# Patient Record
Sex: Male | Born: 1945 | Race: White | Hispanic: No | Marital: Single | Smoking: Current every day smoker
Health system: Southern US, Community
[De-identification: ages and names within clinical notes are randomized; demographics above are authoritative.]

## PROBLEM LIST (undated history)

## (undated) DIAGNOSIS — R208 Other disturbances of skin sensation: Secondary | ICD-10-CM

## (undated) DIAGNOSIS — I739 Peripheral vascular disease, unspecified: Secondary | ICD-10-CM

## (undated) DIAGNOSIS — C801 Malignant (primary) neoplasm, unspecified: Secondary | ICD-10-CM

## (undated) DIAGNOSIS — F329 Major depressive disorder, single episode, unspecified: Secondary | ICD-10-CM

## (undated) DIAGNOSIS — T7840XA Allergy, unspecified, initial encounter: Secondary | ICD-10-CM

## (undated) DIAGNOSIS — M47816 Spondylosis without myelopathy or radiculopathy, lumbar region: Secondary | ICD-10-CM

## (undated) DIAGNOSIS — J449 Chronic obstructive pulmonary disease, unspecified: Secondary | ICD-10-CM

## (undated) DIAGNOSIS — M545 Low back pain, unspecified: Secondary | ICD-10-CM

## (undated) DIAGNOSIS — F419 Anxiety disorder, unspecified: Secondary | ICD-10-CM

## (undated) DIAGNOSIS — M5136 Other intervertebral disc degeneration, lumbar region: Secondary | ICD-10-CM

## (undated) DIAGNOSIS — I4891 Unspecified atrial fibrillation: Secondary | ICD-10-CM

## (undated) DIAGNOSIS — K219 Gastro-esophageal reflux disease without esophagitis: Secondary | ICD-10-CM

## (undated) DIAGNOSIS — I10 Essential (primary) hypertension: Secondary | ICD-10-CM

## (undated) DIAGNOSIS — E119 Type 2 diabetes mellitus without complications: Secondary | ICD-10-CM

## (undated) DIAGNOSIS — F32A Depression, unspecified: Secondary | ICD-10-CM

## (undated) HISTORY — DX: Chronic obstructive pulmonary disease, unspecified: J44.9

## (undated) HISTORY — DX: Other intervertebral disc degeneration, lumbar region: M51.36

## (undated) HISTORY — DX: Low back pain: M54.5

## (undated) HISTORY — DX: Spondylosis without myelopathy or radiculopathy, lumbar region: M47.816

## (undated) HISTORY — DX: Allergy, unspecified, initial encounter: T78.40XA

## (undated) HISTORY — DX: Type 2 diabetes mellitus without complications: E11.9

## (undated) HISTORY — DX: Essential (primary) hypertension: I10

## (undated) HISTORY — DX: Low back pain, unspecified: M54.50

## (undated) HISTORY — DX: Anxiety disorder, unspecified: F41.9

## (undated) HISTORY — PX: TONSILLECTOMY: SUR1361

## (undated) HISTORY — DX: Depression, unspecified: F32.A

## (undated) HISTORY — DX: Gastro-esophageal reflux disease without esophagitis: K21.9

## (undated) HISTORY — DX: Peripheral vascular disease, unspecified: I73.9

## (undated) HISTORY — DX: Other disturbances of skin sensation: R20.8

## (undated) HISTORY — DX: Major depressive disorder, single episode, unspecified: F32.9

## (undated) HISTORY — DX: Malignant (primary) neoplasm, unspecified: C80.1

## (undated) HISTORY — DX: Unspecified atrial fibrillation: I48.91

---

## 2006-12-12 ENCOUNTER — Encounter
Admission: RE | Admit: 2006-12-12 | Discharge: 2006-12-13 | Payer: Self-pay | Admitting: Physical Medicine & Rehabilitation

## 2006-12-12 ENCOUNTER — Ambulatory Visit: Payer: Self-pay | Admitting: Physical Medicine & Rehabilitation

## 2007-08-27 ENCOUNTER — Ambulatory Visit: Payer: Self-pay | Admitting: Cardiology

## 2010-03-05 ENCOUNTER — Encounter: Payer: Self-pay | Admitting: Physical Medicine & Rehabilitation

## 2010-06-27 NOTE — Group Therapy Note (Signed)
Consult requested by Dr. Dyann Ruddle in [place].  Consult requested for the  evaluation of chronic low back pain.   CHIEF COMPLAINT:  Chronic low back pain and poor balance.   A 65 year old male who has a 5 year history of lower back pain that he  describes as aching and occurring more with walking and bending.  He  rates his pain as 7 out of 10, mostly in the lower part of the back, no  difference between the right and left side of his back.  He denies any  history of falls, he does not recall any other traumatic events which  started before his low back.  He has had an MRI which he thinks may have  been 2-3 years ago.  He was told he had arthritis, I do not have that  report.  He can walk 5 minutes at a time and uses a cane.  He climbs  steps, he does not drive.  He was last employed in 1995.  He lives in  senior housing, has some difficulty with household duties.   REVIEW OF SYSTEMS:  Trouble walking with weakness.  He has had some  falls.   SOCIAL HISTORY:  Used Vodka in the past.  He was a heavy drinker in the  past but now drinks, per his report, 2-3 per day.  He smokes about 5  cigarettes per day.   PAST MEDICAL HISTORY:  1. Hypertension.  2. Reflux.  3. What sounds like borderline diabetes.   MEDICATIONS:  1. Norvasc 5 mg a day.  2. Fioricet 2 tablets three times a day for pain.  3. Nexium 40 mg a day.  4. Diabetic medication which he takes as needed, it sounds like      Glucophage 500 mg p.r.n.  5. He also takes Trazodone 50 mg nightly for sleep.   ALLERGIES:  NONE KNOWN.   He does not give any pertinent family history.   Other diagnoses include COPD.  Labs include a normal lipid panel, BUN  and creatinine normal, his GFR is normal, his MCV is normal, CBC is  normal.  He has had lumbar spine films on August 29, 2006, marginal  osteophytes and degenerative spondylosis of the lumber spine, no other  bony abnormalities noted.  He has also had thoracic spine films August 30, 2006 due to fall, marginal osteophyte formation and degenerative  spondylosis.  No compression fractures.   EXAMINATION:  Blood pressure 167/98, he states that he has been taking  his Norvasc on a regular basis, respiratory rate 18, O2 sat 96% room  air.  Mood and affect appropriate, orientation x3, ambulates with a  cane.  He has a stenotic appearing gait, flexed forward at the hips.  He  gets about neutral on extension, forward flexion about 50%, he has  difficulty with sideward bending and twisting.  His motor strength is 4  in the bilateral upper and lower extremities and sensation is intact in  bilateral upper and lower extremities to pinprick.  His deep tendon  reflexes are reduced at the bilateral ankles, normal at the knees,  biceps, triceps and brachial radialis.  His back has some tenderness to  palpation lumbosacral junction.  There was no evidence of scoliosis.  Upper extremity and lower extremity range of motion are normal but he  does have some pain that he states is in the back when he puts his hands  behind his back and his hands behind his head.  No evidence of muscle  atrophy in the extremities.   IMPRESSION:  1. Lumbar pain given age and x-ray findings may have some element of      stenosis, likely has facet arthropathy.  2. Balance disorder given history of heavy alcohol use in the past,      question whether he may have some cerebellar degeneration      contributing to his balance disorder.  If this progresses an MRI of      the head may be helpful as well.   PLAN:  1. We will order MRI of the lumbar spine without contrast, consider      lumbar facet injections.  2. Send to physical therapy to do flexion exercises as well as lower      extremity strengthening and work on balance.  3. Given alcohol abuse history would not use narcotic analgesics in      this case, may benefit from some Ultram, will give him 50 b.i.d.,      no history of seizures.  He also  may benefit from some topical      agent such as Lidoderm or even a Diclofenac topical.   I will see him back in followup, will get a urine drug screen today as  well.  This will include testing for alcohol.  If alcohol positive  today, would recommend some alcohol cessation counseling.      Erick Colace, M.D.  Electronically Signed     AEK/[facility]  D:  12/13/2006 15:30:53  T:  12/14/2006 12:46:51  Job #:  [id number]   cc:   [place]  Fax: [phone]

## 2013-07-10 ENCOUNTER — Other Ambulatory Visit: Payer: Self-pay | Admitting: *Deleted

## 2013-07-10 DIAGNOSIS — M79609 Pain in unspecified limb: Secondary | ICD-10-CM

## 2013-07-17 ENCOUNTER — Encounter: Payer: Self-pay | Admitting: Surgery

## 2013-07-27 ENCOUNTER — Other Ambulatory Visit: Payer: Self-pay | Admitting: Surgery

## 2013-07-27 DIAGNOSIS — I739 Peripheral vascular disease, unspecified: Secondary | ICD-10-CM

## 2013-07-31 ENCOUNTER — Encounter: Payer: Self-pay | Admitting: Surgery

## 2013-08-03 ENCOUNTER — Encounter: Payer: Self-pay | Admitting: Surgery

## 2013-08-03 ENCOUNTER — Ambulatory Visit (HOSPITAL_COMMUNITY)
Admission: RE | Admit: 2013-08-03 | Discharge: 2013-08-03 | Disposition: A | Discharge status: Home / Self Care | Payer: Medicare Other | Source: Ambulatory Visit | Attending: Surgery | Admitting: Surgery

## 2013-08-03 ENCOUNTER — Ambulatory Visit (INDEPENDENT_AMBULATORY_CARE_PROVIDER_SITE_OTHER): Payer: Medicare Other | Admitting: Surgery

## 2013-08-03 ENCOUNTER — Other Ambulatory Visit: Payer: Self-pay | Admitting: Surgery

## 2013-08-03 VITALS — BP 128/83 | HR 99 | Ht 72.0 in | Wt 162.1 lb

## 2013-08-03 DIAGNOSIS — M79609 Pain in unspecified limb: Secondary | ICD-10-CM | POA: Diagnosis present

## 2013-08-03 DIAGNOSIS — I739 Peripheral vascular disease, unspecified: Secondary | ICD-10-CM

## 2013-08-03 DIAGNOSIS — M79604 Pain in right leg: Secondary | ICD-10-CM

## 2013-08-03 MED ORDER — CILOSTAZOL 100 MG PO TABS: 100.0000 mg | ORAL_TABLET | Freq: Two times a day (BID) | ORAL | Status: DC

## 2013-08-03 NOTE — Progress Notes (Signed)
Patient name: Peter Neal MRN: [id number] DOB: 07/14/1945 Sex: male   Referred by: Dr. Francesco Runner  Reason for referral:  Chief Complaint  Patient presents with  . New Evaluation    claudication    HISTORY OF PRESENT ILLNESS: This is a 68 year old gentleman who is referred today for evaluation of lower extremity claudication.  It the patient has a history of low back pain with radicular pain into the right extremity.  L4-L5 epidural injection help with his back pain but he still having significant symptoms in his right leg which she describes as on the lateral surface of the leg from the hip to the knee.  The patient denies calf cramping with walking.  He denies nonhealing wounds on his leg.  He states that he gets pain at night which is predominantly on his heel.  Walking does exacerbate his symptoms somewhat.  The patient continues to smoke.  He has smoked 3 packs a day for a long time but has now quit to less than half a pack per day.  He suffers from hypertension which fluctuates with medications.  He states his cholesterol levels have been okay.  Past Medical History  Diagnosis Date  . Lower back pain   . Dysesthesia   . COPD (chronic obstructive pulmonary disease)   . Allergy   . Hypertension   . GERD (gastroesophageal reflux disease)   . Depression   . Anxiety   . Peripheral vascular disease   . DDD (degenerative disc disease), lumbar     Osteo arthritis  . Lumbar spondylosis   . Atrial fibrillation   . Diabetes mellitus without complication     Past Surgical History  Procedure Laterality Date  . Tonsillectomy      History   Social History  . Marital Status: Single    Spouse Name: N/A    Number of Children: N/A  . Years of Education: N/A   Occupational History  . Not on file.   Social History Main Topics  . Smoking status: Current Every Day Smoker -- 1.00 packs/day for 55 years  . Smokeless tobacco: Never Used  . Alcohol Use: 2.4 oz/week    4 Cans of  beer per week  . Drug Use: No  . Sexual Activity: Not on file   Other Topics Concern  . Not on file   Social History Narrative  . No narrative on file    Family History  Problem Relation Age of Onset  . Cancer Mother   . Diabetes Mother   . Cancer Sister   . Diabetes Sister   . Cancer Sister   . Diabetes Sister     Allergies as of 08/03/2013  . (No Known Allergies)    Current Outpatient Prescriptions on File Prior to Visit  Medication Sig Dispense Refill  . albuterol (PROAIR HFA) 108 (90 BASE) MCG/ACT inhaler Inhale into the lungs every 6 (six) hours as needed for wheezing or shortness of breath.      . ALPRAZolam (XANAX) 0.25 MG tablet Take 0.25 mg by mouth 3 (three) times daily.      Marland Kitchen AMLODIPINE BESYLATE PO Take 10 mg by mouth daily.      Marland Kitchen HYDROcodone-acetaminophen (NORCO) 10-325 MG per tablet Take 1 tablet by mouth every 6 (six) hours as needed.      . montelukast (SINGULAIR) 10 MG tablet Take 10 mg by mouth at bedtime.      . naproxen (NAPROSYN) 500 MG tablet Take 500 mg  by mouth daily as needed.      Marland Kitchen omeprazole (PRILOSEC) 20 MG capsule Take 20 mg by mouth daily.      Marland Kitchen PAROXETINE HCL PO Take 20 mg by mouth every morning.      Marland Kitchen TIZANIDINE HCL PO Take 2 mg by mouth every 8 (eight) hours as needed.       No current facility-administered medications on file prior to visit.     REVIEW OF SYSTEMS: Please see history of present illness, otherwise all systems are negative  PHYSICAL EXAMINATION: General: The patient appears their stated age.  Vital signs are BP 128/83  Pulse 99  Ht 6' (1.829 m)  Wt 162 lb 1.6 oz (73.528 kg)  BMI 21.98 kg/m2  SpO2 98% HEENT:  No gross abnormalities Pulmonary: Respirations are non-labored Abdomen: Soft and non-tender .  Aorta is not palpable Musculoskeletal: There are no major deformities.   Neurologic: No focal weakness or paresthesias are detected, Skin: There are no ulcer or rashes noted. Psychiatric: The patient has normal  affect. Cardiovascular: There is a regular rate and rhythm without significant murmur appreciated.  Palpable femoral pulses.  Palpable left popliteal pulse nonpalpable right.  No carotid bruits.  Diagnostic Studies: I have reviewed his outside imaging studies which reveal an ankle brachial index of 0.96 and the left and 0.61 on the right.  Duplex ultrasound was performed in our office today this shows a right superficial femoral artery occlusion.    Assessment:  Right leg pain Plan: The patient certainly has peripheral vascular disease which affects his right leg.  This is evidenced by the ultrasound today which shows right superficial femoral artery occlusion.  However, the patient's clinical symptoms correlate more with pain secondary to nerve irritation from lower back disc disease.  He reports his symptoms being from the hip to the knee on the lateral side of the leg which is in the L5 distribution.  The patient does not report classic symptoms for vascular claudication, which with a superficial femoral artery occlusion with reduced right calf cramping with activity.  I discussed this with the patient.  From a vascular perspective I told him that out optimize medical therapy at this time.  This would include a cholesterol management and initiation of statin therapy if his cholesterol levels show a LDL of greater than 100.  I also explained the importance of smoking cessation.  I did begin Pletal today to see if this has any improvement on his symptoms.  We discussed the side effect profile.  I told him to discontinue the medication as he has not noticed any benefit within the next one month.  The patient will be scheduled for followup in 3 months to see how he is doing.  Again I think his primary problem is his lower back disease.     Eldridge Abrahams, M.D. [facility] Office: [phone] Pager:  [phone]

## 2013-10-26 ENCOUNTER — Inpatient Hospital Stay (HOSPITAL_COMMUNITY)
Admission: EM | Admit: 2013-10-26 | Discharge: 2013-10-27 | DRG: 392 | Disposition: A | Discharge status: Home / Self Care | Payer: Medicare Other | Attending: Internal Medicine | Admitting: Internal Medicine

## 2013-10-26 ENCOUNTER — Emergency Department (HOSPITAL_COMMUNITY): Payer: Medicare Other

## 2013-10-26 ENCOUNTER — Encounter (HOSPITAL_COMMUNITY): Payer: Self-pay | Admitting: Emergency Medicine

## 2013-10-26 DIAGNOSIS — F17200 Nicotine dependence, unspecified, uncomplicated: Secondary | ICD-10-CM | POA: Diagnosis present

## 2013-10-26 DIAGNOSIS — F1020 Alcohol dependence, uncomplicated: Secondary | ICD-10-CM | POA: Diagnosis present

## 2013-10-26 DIAGNOSIS — I739 Peripheral vascular disease, unspecified: Secondary | ICD-10-CM | POA: Diagnosis present

## 2013-10-26 DIAGNOSIS — R634 Abnormal weight loss: Secondary | ICD-10-CM

## 2013-10-26 DIAGNOSIS — IMO0002 Reserved for concepts with insufficient information to code with codable children: Secondary | ICD-10-CM | POA: Diagnosis present

## 2013-10-26 DIAGNOSIS — J4489 Other specified chronic obstructive pulmonary disease: Secondary | ICD-10-CM | POA: Diagnosis present

## 2013-10-26 DIAGNOSIS — M5137 Other intervertebral disc degeneration, lumbosacral region: Secondary | ICD-10-CM | POA: Diagnosis present

## 2013-10-26 DIAGNOSIS — Z833 Family history of diabetes mellitus: Secondary | ICD-10-CM | POA: Diagnosis not present

## 2013-10-26 DIAGNOSIS — R1310 Dysphagia, unspecified: Secondary | ICD-10-CM | POA: Diagnosis present

## 2013-10-26 DIAGNOSIS — K5710 Diverticulosis of small intestine without perforation or abscess without bleeding: Secondary | ICD-10-CM | POA: Diagnosis present

## 2013-10-26 DIAGNOSIS — K219 Gastro-esophageal reflux disease without esophagitis: Secondary | ICD-10-CM | POA: Diagnosis present

## 2013-10-26 DIAGNOSIS — K2990 Gastroduodenitis, unspecified, without bleeding: Secondary | ICD-10-CM | POA: Diagnosis present

## 2013-10-26 DIAGNOSIS — K229 Disease of esophagus, unspecified: Secondary | ICD-10-CM | POA: Diagnosis present

## 2013-10-26 DIAGNOSIS — J449 Chronic obstructive pulmonary disease, unspecified: Secondary | ICD-10-CM | POA: Diagnosis present

## 2013-10-26 DIAGNOSIS — K2980 Duodenitis without bleeding: Secondary | ICD-10-CM | POA: Diagnosis present

## 2013-10-26 DIAGNOSIS — F1010 Alcohol abuse, uncomplicated: Secondary | ICD-10-CM | POA: Diagnosis present

## 2013-10-26 DIAGNOSIS — I4891 Unspecified atrial fibrillation: Secondary | ICD-10-CM | POA: Diagnosis present

## 2013-10-26 DIAGNOSIS — K228 Other specified diseases of esophagus: Secondary | ICD-10-CM | POA: Diagnosis present

## 2013-10-26 DIAGNOSIS — T18108A Unspecified foreign body in esophagus causing other injury, initial encounter: Secondary | ICD-10-CM | POA: Diagnosis present

## 2013-10-26 DIAGNOSIS — K2289 Other specified disease of esophagus: Secondary | ICD-10-CM

## 2013-10-26 DIAGNOSIS — I10 Essential (primary) hypertension: Secondary | ICD-10-CM | POA: Diagnosis present

## 2013-10-26 DIAGNOSIS — K2970 Gastritis, unspecified, without bleeding: Secondary | ICD-10-CM | POA: Diagnosis present

## 2013-10-26 DIAGNOSIS — M51379 Other intervertebral disc degeneration, lumbosacral region without mention of lumbar back pain or lower extremity pain: Secondary | ICD-10-CM | POA: Diagnosis present

## 2013-10-26 DIAGNOSIS — E119 Type 2 diabetes mellitus without complications: Secondary | ICD-10-CM | POA: Diagnosis present

## 2013-10-26 LAB — CBC WITH DIFFERENTIAL/PLATELET
BASOS PCT: 0 % (ref 0–1)
Basophils Absolute: 0 10*3/uL (ref 0.0–0.1)
EOS ABS: 0.1 10*3/uL (ref 0.0–0.7)
Eosinophils Relative: 1 % (ref 0–5)
HCT: 47.2 % (ref 39.0–52.0)
HEMOGLOBIN: 16.4 g/dL (ref 13.0–17.0)
Lymphocytes Relative: 19 % (ref 12–46)
Lymphs Abs: 2 10*3/uL (ref 0.7–4.0)
MCH: 34 pg (ref 26.0–34.0)
MCHC: 34.7 g/dL (ref 30.0–36.0)
MCV: 97.7 fL (ref 78.0–100.0)
MONOS PCT: 12 % (ref 3–12)
Monocytes Absolute: 1.3 10*3/uL — ABNORMAL HIGH (ref 0.1–1.0)
NEUTROS PCT: 68 % (ref 43–77)
Neutro Abs: 7.1 10*3/uL (ref 1.7–7.7)
Platelets: 253 10*3/uL (ref 150–400)
RBC: 4.83 MIL/uL (ref 4.22–5.81)
RDW: 13.5 % (ref 11.5–15.5)
WBC: 10.5 10*3/uL (ref 4.0–10.5)

## 2013-10-26 LAB — COMPREHENSIVE METABOLIC PANEL
ALBUMIN: 3.3 g/dL — AB (ref 3.5–5.2)
ALK PHOS: 125 U/L — AB (ref 39–117)
ALT: 11 U/L (ref 0–53)
ANION GAP: 16 — AB (ref 5–15)
AST: 17 U/L (ref 0–37)
BUN: 13 mg/dL (ref 6–23)
CO2: 26 mEq/L (ref 19–32)
Calcium: 10 mg/dL (ref 8.4–10.5)
Chloride: 97 mEq/L (ref 96–112)
Creatinine, Ser: 0.86 mg/dL (ref 0.50–1.35)
GFR calc Af Amer: >90 mL/min (ref >90)
GFR calc non Af Amer: 86 mL/min — ABNORMAL LOW (ref >90)
GLUCOSE: 109 mg/dL — AB (ref 70–99)
POTASSIUM: 3.5 meq/L — AB (ref 3.7–5.3)
Sodium: 139 mEq/L (ref 137–147)
Total Bilirubin: 0.5 mg/dL (ref 0.3–1.2)
Total Protein: 8.2 g/dL (ref 6.0–8.3)

## 2013-10-26 MED ORDER — MONTELUKAST SODIUM 10 MG PO TABS: 10.0000 mg | ORAL_TABLET | Freq: Every day | ORAL | Status: DC

## 2013-10-26 MED ORDER — ONDANSETRON HCL 4 MG/2ML IJ SOLN: 4.0000 mg | Freq: Four times a day (QID) | INTRAMUSCULAR | Status: DC | PRN

## 2013-10-26 MED ORDER — SODIUM CHLORIDE 0.9 % IV SOLN
INTRAVENOUS | Status: DC
  Administered 2013-10-26 – 2013-10-27 (×2): via INTRAVENOUS

## 2013-10-26 MED ORDER — ALBUTEROL SULFATE (2.5 MG/3ML) 0.083% IN NEBU: 3.0000 mL | INHALATION_SOLUTION | RESPIRATORY_TRACT | Status: DC | PRN

## 2013-10-26 MED ORDER — TIZANIDINE HCL 4 MG PO TABS
2.0000 mg | ORAL_TABLET | Freq: Three times a day (TID) | ORAL | Status: DC | PRN
  Filled 2013-10-26: qty 1

## 2013-10-26 MED ORDER — SODIUM CHLORIDE 0.9 % IV BOLUS (SEPSIS)
2000.0000 mL | Freq: Once | INTRAVENOUS | Status: AC
  Administered 2013-10-26: 1000 mL via INTRAVENOUS

## 2013-10-26 MED ORDER — PANTOPRAZOLE SODIUM 40 MG PO TBEC
40.0000 mg | DELAYED_RELEASE_TABLET | Freq: Every day | ORAL | Status: DC
  Administered 2013-10-26 – 2013-10-27 (×2): 40 mg via ORAL
  Filled 2013-10-26 (×2): qty 1

## 2013-10-26 MED ORDER — HYDROCODONE-ACETAMINOPHEN 10-325 MG PO TABS
1.0000 | ORAL_TABLET | Freq: Four times a day (QID) | ORAL | Status: DC | PRN
  Administered 2013-10-26 – 2013-10-27 (×2): 1 via ORAL
  Filled 2013-10-26 (×2): qty 1

## 2013-10-26 MED ORDER — ALBUTEROL SULFATE HFA 108 (90 BASE) MCG/ACT IN AERS: 2.0000 | INHALATION_SPRAY | RESPIRATORY_TRACT | Status: DC | PRN

## 2013-10-26 MED ORDER — ALPRAZOLAM 0.5 MG PO TABS
0.5000 mg | ORAL_TABLET | Freq: Four times a day (QID) | ORAL | Status: DC
  Administered 2013-10-26 – 2013-10-27 (×5): 0.5 mg via ORAL
  Filled 2013-10-26 (×4): qty 1

## 2013-10-26 MED ORDER — ONDANSETRON HCL 4 MG PO TABS: 4.0000 mg | ORAL_TABLET | Freq: Four times a day (QID) | ORAL | Status: DC | PRN

## 2013-10-26 MED ORDER — CILOSTAZOL 100 MG PO TABS
100.0000 mg | ORAL_TABLET | Freq: Two times a day (BID) | ORAL | Status: DC
  Administered 2013-10-27 (×2): 100 mg via ORAL
  Filled 2013-10-26 (×3): qty 1

## 2013-10-26 NOTE — ED Provider Notes (Signed)
CSN: [id number]     Arrival date & time 10/26/13  1531 History   First MD Initiated Contact with Patient 10/26/13 1630     This chart was scribed for Babette Relic, MD by Edison Simon, ED Scribe. This patient was seen in room A304/A304-01 and the patient's care was started at 4:33 PM.   Chief Complaint  Patient presents with  . Emesis   The history is provided by the patient. No language interpreter was used.   HPI Comments: Peter Neal is a 68 y.o. male with history of stable lower back pain radiating down his leg and COPD who presents to the Emergency Department complaining of dysphagia for solid food with onset 1 week ago. He states he is able to tolerate liquids and his own saliva. He states he has to have small amounts of solid food, otherwise he regurgitates it. He denies chest pain, shortness of breath, abdominal pain, or diarrhea.   Past Medical History  Diagnosis Date  . Lower back pain   . Dysesthesia   . COPD (chronic obstructive pulmonary disease)   . Allergy   . Hypertension   . GERD (gastroesophageal reflux disease)   . Depression   . Anxiety   . Peripheral vascular disease   . DDD (degenerative disc disease), lumbar     Osteo arthritis  . Lumbar spondylosis   . Atrial fibrillation   . Diabetes mellitus without complication    Past Surgical History  Procedure Laterality Date  . Tonsillectomy     Family History  Problem Relation Age of Onset  . Cancer Mother   . Diabetes Mother   . Cancer Sister   . Diabetes Sister   . Cancer Sister   . Diabetes Sister   . Colon cancer Neg Hx    History  Substance Use Topics  . Smoking status: Current Every Day Smoker -- 1.00 packs/day for 55 years  . Smokeless tobacco: Never Used  . Alcohol Use: 25.2 oz/week    42 Cans of beer per week     Comment: ETOH abuse, 6-12 beers a day    Review of Systems 10 Systems reviewed and are negative for acute change except as noted in the HPI.   Allergies  Review of patient's  allergies indicates no known allergies.  Home Medications   Prior to Admission medications   Medication Sig Start Date End Date Taking? Authorizing Provider  albuterol (PROAIR HFA) 108 (90 BASE) MCG/ACT inhaler Inhale 2 puffs into the lungs every 4 (four) hours as needed for wheezing or shortness of breath.    Yes Historical Provider, MD  ALPRAZolam Duanne Moron) 0.5 MG tablet Take 0.5 mg by mouth 4 (four) times daily.   Yes Historical Provider, MD  cilostazol (PLETAL) 100 MG tablet Take 100 mg by mouth 2 (two) times daily before a meal.   Yes Historical Provider, MD  HYDROcodone-acetaminophen (NORCO) 10-325 MG per tablet Take 1 tablet by mouth every 6 (six) hours as needed for moderate pain or severe pain.    Yes Historical Provider, MD  montelukast (SINGULAIR) 10 MG tablet Take 10 mg by mouth at bedtime.   Yes Historical Provider, MD  omeprazole (PRILOSEC) 20 MG capsule Take 20 mg by mouth 2 (two) times daily.   Yes Historical Provider, MD  tiZANidine (ZANAFLEX) 2 MG tablet Take 2 mg by mouth every 8 (eight) hours as needed for muscle spasms.   Yes Historical Provider, MD   BP 111/74  Pulse 90  Temp(Src)  96.8 F (36 C) (Axillary)  Resp 18  Ht 6\' 1"  (1.854 m)  Wt 144 lb 0.1 oz (65.32 kg)  BMI 19.00 kg/m2  SpO2 92% Physical Exam  Nursing note and vitals reviewed. Constitutional:  Awake, alert, nontoxic appearance.  HENT:  Head: Atraumatic.  Eyes: Right eye exhibits no discharge. Left eye exhibits no discharge.  Neck: Neck supple.  Cardiovascular: Normal rate, regular rhythm and normal heart sounds.   No murmur heard. Pulmonary/Chest: Effort normal and breath sounds normal. No respiratory distress. He has no wheezes. He has no rales. He exhibits no tenderness.  Abdominal: Soft. There is no tenderness. There is no rebound.  Musculoskeletal: He exhibits no tenderness.  Baseline ROM, no obvious new focal weakness.  Neurological:  Mental status and motor strength appears baseline for  patient and situation.  Skin: No rash noted.  Psychiatric: He has a normal mood and affect.    ED Course  Procedures (including critical care time) D/w GI Fields recs soft mechanical diet consistency of milkshake; OutPt f/u. 1715  1750 additional history obtained from family states patient has been unable to eat or drink anything today including regurgitating own saliva and family states patient is lying so ED nursing directly observed the patient attempt to drink and witnessed regurgitation of drink and patient's own saliva so GI contacted again and patient will be placed in observation to [facility] with GI consultation either tonight or tomorrow morning.  Patient / Family / Caregiver informed of clinical course, understand medical decision-making process, and agree with plan.   Labs Review Labs Reviewed  CBC WITH DIFFERENTIAL - Abnormal; Notable for the following:    Monocytes Absolute 1.3 (*)    All other components within normal limits  COMPREHENSIVE METABOLIC PANEL - Abnormal; Notable for the following:    Potassium 3.5 (*)    Glucose, Bld 109 (*)    Albumin 3.3 (*)    Alkaline Phosphatase 125 (*)    GFR calc non Af Amer 86 (*)    Anion gap 16 (*)    All other components within normal limits  COMPREHENSIVE METABOLIC PANEL - Abnormal; Notable for the following:    Albumin 2.8 (*)    GFR calc non Af Amer 89 (*)    All other components within normal limits  CBC  SURGICAL PATHOLOGY    Imaging Review Ct Chest W Contrast  10/27/2013   CLINICAL DATA:  Esophageal mass  EXAM: CT CHEST, ABDOMEN, AND PELVIS WITH CONTRAST  TECHNIQUE: Multidetector CT imaging of the chest, abdomen and pelvis was performed following the standard protocol during bolus administration of intravenous contrast.  CONTRAST:  33mL OMNIPAQUE IOHEXOL 300 MG/ML SOLN, 160mL OMNIPAQUE IOHEXOL 300 MG/ML SOLN  COMPARISON:  None  FINDINGS: CT CHEST FINDINGS  Mediastinum: The heart size is normal. There is no  pericardial effusion. Calcified atherosclerotic disease involves the thoracic aorta as well as the RCA, LAD and left circumflex coronary arteries. The trachea appears patent and is midline.  Circumferential esophageal mass is identified. This is centered at the level of the left atrium and measures roughly 3.6 x 1.8 x 10.1 cm.At the GE junction there are several small paraesophageal lymph nodes measuring up to 7 mm, image 53/series 6. Enhancing sub- carinal lymph node measures 7 mm, image 31/series 6. Small left paratracheal lymph node at the level of the thoracic inlet measures 7 mm, image 10/series 6. No right paratracheal, prevascular, AP window or hilar adenopathy. There is no axillary or supraclavicular adenopathy.  Lungs/Pleura: There is no pleural effusion. Dependent changes are noted in the lung bases posteriorly. Diffuse interstitial accentuation is identified. There is mild peripheral interstitial reticulation is identified. A fine nodular appearance is identified bilaterally. Several dominant nodules are noted. Index nodule in the right upper lobe measures 1.4 cm, image 30/ series 12. Index nodule in the left lower lobe measures 4 mm.  CT ABDOMEN AND PELVIS FINDINGS  Hepatobiliary: Multiple ill-defined low-attenuation lesions are identified in both lobes of liver. Index lesion within the left hepatic lobe measures 1.9 cm, image 13 of series 3. There is a lesion within the right hepatic lobe measuring 1 cm, image 15/series 3. Calcifications within the wall of gallbladder noted. There is no biliary dilatation.  Spleen: Appears normal.  Pancreas: Diffuse atrophy of the pancreas.  Stomach/Bowel: The stomach and the small bowel loops have a normal course and caliber. No obstruction. Normal appearance of the colon.  Adrenals/urinary tract: The adrenal glands are both normal. Normal appearance of both kidneys. The urinary bladder appears within normal limits.  Vascular/Lymphatic: Calcified atherosclerotic  disease involves the abdominal aorta. Large gastrohepatic ligament lymph node measures 2.9 x 2.1 cm. No mesenteric adenopathy. There is no pelvic or inguinal adenopathy.  Reproductive: Prostate gland and seminal vesicles are unremarkable.  Musculoskeletal: Review of the visualized bony structures shows advanced changes of a new vascular necrosis and secondary degenerative change of the right hip. The left hip appears normal. No aggressive lytic or sclerotic bone lesions noted.  Other: None  IMPRESSION: 1. 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 2. 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 3. Enlarged upper abdominal lymph node worrisome for metastatic adenopathy. 4. Small enhancing mediastinal lymph nodes may also represent sites of metastasis. 5. 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 6. Atherosclerotic disease including multi vessel coronary artery calcification.   Electronically Signed   By: Kerby Moors M.D.   On: 10/27/2013 19:20   Ct Abdomen Pelvis W Contrast  10/27/2013   CLINICAL DATA:  Esophageal mass  EXAM: CT CHEST, ABDOMEN, AND PELVIS WITH CONTRAST  TECHNIQUE: Multidetector CT imaging of the chest, abdomen and pelvis was performed following the standard protocol during bolus administration of intravenous contrast.  CONTRAST:  25mL OMNIPAQUE IOHEXOL 300 MG/ML SOLN, 135mL OMNIPAQUE IOHEXOL 300 MG/ML SOLN  COMPARISON:  None  FINDINGS: CT CHEST FINDINGS  Mediastinum: The heart size is normal. There is no pericardial effusion. Calcified atherosclerotic disease involves the thoracic aorta as well as the RCA, LAD and left circumflex coronary arteries. The trachea appears patent and is midline.  Circumferential esophageal mass is identified. This is centered at the level of the left atrium and measures roughly 3.6 x 1.8 x 10.1 cm.At the GE junction there are several small  paraesophageal lymph nodes measuring up to 7 mm, image 53/series 6. Enhancing sub- carinal lymph node measures 7 mm, image 31/series 6. Small left paratracheal lymph node at the level of the thoracic inlet measures 7 mm, image 10/series 6. No right paratracheal, prevascular, AP window or hilar adenopathy. There is no axillary or supraclavicular adenopathy.  Lungs/Pleura: There is no pleural effusion. Dependent changes are noted in the lung bases posteriorly. Diffuse interstitial accentuation is identified. There is mild peripheral interstitial reticulation is identified. A fine nodular appearance is identified bilaterally. Several dominant nodules are noted. Index nodule in the right upper lobe measures 1.4 cm, image 30/ series 12. Index nodule in the left lower  lobe measures 4 mm.  CT ABDOMEN AND PELVIS FINDINGS  Hepatobiliary: Multiple ill-defined low-attenuation lesions are identified in both lobes of liver. Index lesion within the left hepatic lobe measures 1.9 cm, image 13 of series 3. There is a lesion within the right hepatic lobe measuring 1 cm, image 15/series 3. Calcifications within the wall of gallbladder noted. There is no biliary dilatation.  Spleen: Appears normal.  Pancreas: Diffuse atrophy of the pancreas.  Stomach/Bowel: The stomach and the small bowel loops have a normal course and caliber. No obstruction. Normal appearance of the colon.  Adrenals/urinary tract: The adrenal glands are both normal. Normal appearance of both kidneys. The urinary bladder appears within normal limits.  Vascular/Lymphatic: Calcified atherosclerotic disease involves the abdominal aorta. Large gastrohepatic ligament lymph node measures 2.9 x 2.1 cm. No mesenteric adenopathy. There is no pelvic or inguinal adenopathy.  Reproductive: Prostate gland and seminal vesicles are unremarkable.  Musculoskeletal: Review of the visualized bony structures shows advanced changes of a new vascular necrosis and secondary degenerative  change of the right hip. The left hip appears normal. No aggressive lytic or sclerotic bone lesions noted.  Other: None  IMPRESSION: 1. 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 2. 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 3. Enlarged upper abdominal lymph node worrisome for metastatic adenopathy. 4. Small enhancing mediastinal lymph nodes may also represent sites of metastasis. 5. 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 6. Atherosclerotic disease including multi vessel coronary artery calcification.   Electronically Signed   By: Kerby Moors M.D.   On: 10/27/2013 19:20     EKG Interpretation None     Patient / Family / Caregiver understand and agree with initial ED impression and plan with expectations set for ED visit.   MDM   Final diagnoses:  Dysphagia    I personally performed the services described in this documentation, which was scribed in my presence. The recorded information has been reviewed and is accurate. The patient appears reasonably stabilized for admission considering the current resources, flow, and capabilities available in the ED at this time, and I doubt any other [facility] requiring further screening and/or treatment in the ED prior to admission.   Babette Relic, MD 10/28/13 [phone]

## 2013-10-26 NOTE — H&P (Signed)
[facility] History and Physical  Khristopher Kapaun ERD:[id number] DOB: 10-21-1945 DOA: 10/26/2013  Referring physician: ER PCP: Cleda Mccreedy, MD   Chief Complaint: Dysphagia  HPI: Peter Neal is a 68 y.o. male  This is a 68 year old man who complains of fairly sudden onset of dysphagia for the last week. Apparently did not swallow his own sputum at this point. He also describes a 40 pound weight loss in the last couple of months. He is a smoker of 3 packs of cigarettes per day. He drinks alcohol 12-24 beers a day usually. He denies any nausea or vomiting or abdominal pain. He is now being admitted for further investigation.   Review of Systems:  .  HEENT:  No headaches, Difficulty swallowing,Tooth/dental problems,Sore throat,  No sneezing, itching, ear ache, nasal congestion, post nasal drip,  Cardio-vascular:  No chest pain, Orthopnea, PND, swelling in lower extremities, anasarca, dizziness, palpitations   Resp:  No shortness of breath with exertion or at rest. No excess mucus, no productive cough, No non-productive cough, No coughing up of blood.No change in color of mucus.No wheezing.No chest wall deformity  Skin:  no rash or lesions.  GU:  no dysuria, change in color of urine, no urgency or frequency. No flank pain.  Musculoskeletal:  No joint pain or swelling. No decreased range of motion. No back pain.  Psych:  No change in mood or affect. No depression or anxiety. No memory loss.   Past Medical History  Diagnosis Date  . Lower back pain   . Dysesthesia   . COPD (chronic obstructive pulmonary disease)   . Allergy   . Hypertension   . GERD (gastroesophageal reflux disease)   . Depression   . Anxiety   . Peripheral vascular disease   . DDD (degenerative disc disease), lumbar     Osteo arthritis  . Lumbar spondylosis   . Atrial fibrillation   . Diabetes mellitus without complication    Past Surgical History  Procedure Laterality Date  . Tonsillectomy      Social History:  reports that he has been smoking.  He has never used smokeless tobacco. He reports that he drinks about 2.4 ounces of alcohol per week. He reports that he does not use illicit drugs.  No Known Allergies  Family History  Problem Relation Age of Onset  . Cancer Mother   . Diabetes Mother   . Cancer Sister   . Diabetes Sister   . Cancer Sister   . Diabetes Sister      Prior to Admission medications   Medication Sig Start Date End Date Taking? Authorizing Provider  albuterol (PROAIR HFA) 108 (90 BASE) MCG/ACT inhaler Inhale 2 puffs into the lungs every 4 (four) hours as needed for wheezing or shortness of breath.    Yes Historical Provider, MD  ALPRAZolam Duanne Moron) 0.5 MG tablet Take 0.5 mg by mouth 4 (four) times daily.   Yes Historical Provider, MD  cilostazol (PLETAL) 100 MG tablet Take 100 mg by mouth 2 (two) times daily before a meal.   Yes Historical Provider, MD  HYDROcodone-acetaminophen (NORCO) 10-325 MG per tablet Take 1 tablet by mouth every 6 (six) hours as needed for moderate pain or severe pain.    Yes Historical Provider, MD  montelukast (SINGULAIR) 10 MG tablet Take 10 mg by mouth at bedtime.   Yes Historical Provider, MD  omeprazole (PRILOSEC) 20 MG capsule Take 20 mg by mouth 2 (two) times daily.   Yes Historical Provider, MD  tiZANidine (  ZANAFLEX) 2 MG tablet Take 2 mg by mouth every 8 (eight) hours as needed for muscle spasms.   Yes Historical Provider, MD   Physical Exam: Filed Vitals:   10/26/13 1546 10/26/13 1641 10/26/13 1803  BP: 116/81 137/102 133/91  Pulse: 112 111 101  Temp: 97.6 F (36.4 C) 97.6 F (36.4 C)   TempSrc: Oral Oral   Resp: 18 20 18   Height: 6\' 1"  (1.854 m)    Weight: 65.431 kg (144 lb 4 oz)    SpO2: 98% 98% 98%    Wt Readings from Last 3 Encounters:  10/26/13 65.431 kg (144 lb 4 oz)  08/03/13 73.528 kg (162 lb 1.6 oz)    General:  Appears calm and comfortable. No supraclavicular lymphadenopathy. No clubbing. Eyes:  PERRL, normal lids, irises & conjunctiva ENT: grossly normal hearing, lips & tongue Neck: no LAD, masses or thyromegaly Cardiovascular: RRR, no m/r/g. No LE edema. Telemetry: SR, no arrhythmias  Respiratory: CTA bilaterally, no w/r/r. Normal respiratory effort. Abdomen: soft, ntnd Skin: no rash or induration seen on limited exam Musculoskeletal: grossly normal tone BUE/BLE Psychiatric: grossly normal mood and affect, speech fluent and appropriate Neurologic: grossly non-focal.          Labs on Admission:  Basic Metabolic Panel:  Recent Labs Lab 10/26/13 1621  NA 139  K 3.5*  CL 97  CO2 26  GLUCOSE 109*  BUN 13  CREATININE 0.86  CALCIUM 10.0   Liver Function Tests:  Recent Labs Lab 10/26/13 1621  AST 17  ALT 11  ALKPHOS 125*  BILITOT 0.5  PROT 8.2  ALBUMIN 3.3*   No results found for this basename: LIPASE, AMYLASE,  in the last 168 hours No results found for this basename: AMMONIA,  in the last 168 hours CBC:  Recent Labs Lab 10/26/13 1621  WBC 10.5  NEUTROABS 7.1  HGB 16.4  HCT 47.2  MCV 97.7  PLT 253   Cardiac Enzymes: No results found for this basename: CKTOTAL, CKMB, CKMBINDEX, TROPONINI,  in the last 168 hours  BNP (last 3 results) No results found for this basename: PROBNP,  in the last 8760 hours CBG: No results found for this basename: GLUCAP,  in the last 168 hours  Radiological Exams on Admission: Dg Chest 2 View  10/26/2013   CLINICAL DATA:  Difficulty swallowing x1 week  EXAM: CHEST  2 VIEW  COMPARISON:  None.  FINDINGS: Chronic interstitial markings. No focal consolidation. No pleural effusion or pneumothorax.  The heart is normal in size.  Mild degenerative changes of the visualized thoracolumbar spine.  IMPRESSION: No evidence of acute cardiopulmonary disease.   Electronically Signed   By: Julian Hy M.D.   On: 10/26/2013 18:54    Assessment/Plan   1. Dysphagia, fairly rapid onset associated with weight loss of 40 pounds in  the last couple of months. Etiology unclear, but malignancy would be a major concern. 2. Tobacco abuse. 3. Alcohol abuse.  Plan: 1. Admit to medical floor. 2. IV fluids. 3. Gastroenterology consultation with EGD soon.  Other recommendations will depend on patient's hospital progress.   Code Status: Full code.   DVT Prophylaxis: SCDs.  Family Communication: I discussed the plan with patient at the bedside.   Disposition Plan: Depending on progress.   Time spent: 60 minutes.  Doree Albee [facility] Pager [phone].  **Disclaimer: This note may have been dictated with voice recognition software. Similar sounding words can inadvertently be transcribed and this note may contain transcription errors  which may not have been corrected upon publication of note.**

## 2013-10-26 NOTE — ED Notes (Addendum)
Difficulty swallowing for 1 week, food comes up. Seen by MD and told to come here.  Able  Swallow saliva.

## 2013-10-26 NOTE — ED Notes (Signed)
Spoke with Dr. Oneida Alar who instructed that the patient be NPO after midnight tonight and show up tomorrow morning at 1030 at endoscopy for an upper endoscopy. Communicated with the patient and family at bedside to relay message and gave written instructions as well.

## 2013-10-27 ENCOUNTER — Encounter (HOSPITAL_COMMUNITY)
Admission: EM | Disposition: A | Discharge status: Home / Self Care | Payer: Self-pay | Source: Home / Self Care | Attending: Internal Medicine

## 2013-10-27 ENCOUNTER — Encounter (HOSPITAL_COMMUNITY): Payer: Self-pay | Admitting: Gastroenterology

## 2013-10-27 ENCOUNTER — Inpatient Hospital Stay (HOSPITAL_COMMUNITY): Payer: Medicare Other

## 2013-10-27 DIAGNOSIS — T18108A Unspecified foreign body in esophagus causing other injury, initial encounter: Secondary | ICD-10-CM

## 2013-10-27 DIAGNOSIS — K2289 Other specified disease of esophagus: Secondary | ICD-10-CM | POA: Diagnosis present

## 2013-10-27 DIAGNOSIS — F17200 Nicotine dependence, unspecified, uncomplicated: Secondary | ICD-10-CM

## 2013-10-27 DIAGNOSIS — F1010 Alcohol abuse, uncomplicated: Secondary | ICD-10-CM

## 2013-10-27 DIAGNOSIS — K228 Other specified diseases of esophagus: Secondary | ICD-10-CM | POA: Diagnosis present

## 2013-10-27 DIAGNOSIS — K229 Disease of esophagus, unspecified: Principal | ICD-10-CM

## 2013-10-27 HISTORY — PX: ESOPHAGOGASTRODUODENOSCOPY: SHX5428

## 2013-10-27 LAB — CBC
HEMATOCRIT: 43.1 % (ref 39.0–52.0)
Hemoglobin: 14.7 g/dL (ref 13.0–17.0)
MCH: 33.6 pg (ref 26.0–34.0)
MCHC: 34.1 g/dL (ref 30.0–36.0)
MCV: 98.6 fL (ref 78.0–100.0)
PLATELETS: 242 10*3/uL (ref 150–400)
RBC: 4.37 MIL/uL (ref 4.22–5.81)
RDW: 13.6 % (ref 11.5–15.5)
WBC: 8.6 10*3/uL (ref 4.0–10.5)

## 2013-10-27 LAB — COMPREHENSIVE METABOLIC PANEL
ALT: 9 U/L (ref 0–53)
AST: 14 U/L (ref 0–37)
Albumin: 2.8 g/dL — ABNORMAL LOW (ref 3.5–5.2)
Alkaline Phosphatase: 108 U/L (ref 39–117)
Anion gap: 11 (ref 5–15)
BILIRUBIN TOTAL: 0.5 mg/dL (ref 0.3–1.2)
BUN: 13 mg/dL (ref 6–23)
CHLORIDE: 101 meq/L (ref 96–112)
CO2: 29 mEq/L (ref 19–32)
Calcium: 9.2 mg/dL (ref 8.4–10.5)
Creatinine, Ser: 0.79 mg/dL (ref 0.50–1.35)
GFR calc Af Amer: >90 mL/min (ref >90)
GFR calc non Af Amer: 89 mL/min — ABNORMAL LOW (ref >90)
Glucose, Bld: 92 mg/dL (ref 70–99)
Potassium: 3.9 mEq/L (ref 3.7–5.3)
Sodium: 141 mEq/L (ref 137–147)
Total Protein: 7 g/dL (ref 6.0–8.3)

## 2013-10-27 SURGERY — EGD (ESOPHAGOGASTRODUODENOSCOPY)
Anesthesia: Moderate Sedation

## 2013-10-27 MED ORDER — SODIUM CHLORIDE 0.9 % IV SOLN: INTRAVENOUS | Status: DC

## 2013-10-27 MED ORDER — MEPERIDINE HCL 100 MG/ML IJ SOLN
INTRAMUSCULAR | Status: DC | PRN
  Administered 2013-10-27 (×2): 25 mg via INTRAVENOUS
  Administered 2013-10-27: 50 mg via INTRAVENOUS

## 2013-10-27 MED ORDER — MIDAZOLAM HCL 5 MG/5ML IJ SOLN
INTRAMUSCULAR | Status: AC
  Filled 2013-10-27: qty 10

## 2013-10-27 MED ORDER — MIDAZOLAM HCL 5 MG/5ML IJ SOLN
INTRAMUSCULAR | Status: DC | PRN
  Administered 2013-10-27 (×2): 1 mg via INTRAVENOUS
  Administered 2013-10-27: 2 mg via INTRAVENOUS

## 2013-10-27 MED ORDER — STERILE WATER FOR IRRIGATION IR SOLN
Status: DC | PRN
Start: 2013-10-27 — End: 2013-10-27
  Administered 2013-10-27: 12:00:00

## 2013-10-27 MED ORDER — NICOTINE 21 MG/24HR TD PT24
21.0000 mg | MEDICATED_PATCH | Freq: Every day | TRANSDERMAL | Status: DC
  Administered 2013-10-27: 21 mg via TRANSDERMAL
  Filled 2013-10-27: qty 1

## 2013-10-27 MED ORDER — PROMETHAZINE HCL 25 MG/ML IJ SOLN
12.5000 mg | Freq: Once | INTRAMUSCULAR | Status: AC
  Administered 2013-10-27: 12.5 mg via INTRAVENOUS
  Filled 2013-10-27: qty 1

## 2013-10-27 MED ORDER — LIDOCAINE VISCOUS 2 % MT SOLN
OROMUCOSAL | Status: AC
  Filled 2013-10-27: qty 15

## 2013-10-27 MED ORDER — SODIUM CHLORIDE 0.9 % IV SOLN
INTRAVENOUS | Status: DC
  Administered 2013-10-27: 1000 mL via INTRAVENOUS

## 2013-10-27 MED ORDER — IOHEXOL 300 MG/ML  SOLN
50.0000 mL | Freq: Once | INTRAMUSCULAR | Status: AC | PRN
  Administered 2013-10-27: 50 mL via ORAL

## 2013-10-27 MED ORDER — MINERAL OIL PO OIL
TOPICAL_OIL | ORAL | Status: AC
  Filled 2013-10-27: qty 30

## 2013-10-27 MED ORDER — PANTOPRAZOLE SODIUM 40 MG PO TBEC
40.0000 mg | DELAYED_RELEASE_TABLET | Freq: Two times a day (BID) | ORAL | Status: DC
  Administered 2013-10-27: 40 mg via ORAL
  Filled 2013-10-27: qty 1

## 2013-10-27 MED ORDER — MEPERIDINE HCL 100 MG/ML IJ SOLN
INTRAMUSCULAR | Status: AC
  Filled 2013-10-27: qty 2

## 2013-10-27 MED ORDER — IOHEXOL 300 MG/ML  SOLN
100.0000 mL | Freq: Once | INTRAMUSCULAR | Status: AC | PRN
  Administered 2013-10-27: 100 mL via INTRAVENOUS

## 2013-10-27 MED ORDER — LIDOCAINE VISCOUS 2 % MT SOLN
OROMUCOSAL | Status: DC | PRN
  Administered 2013-10-27: 3 mL via OROMUCOSAL

## 2013-10-27 NOTE — H&P (Signed)
Primary Care Physician:  Cleda Mccreedy, MD Primary Gastroenterologist:  Dr. Oneida Alar  Pre-Procedure History & Physical: HPI:  Peter Neal is a 68 y.o. male here for DYSPHAGIA.  Past Medical History  Diagnosis Date  . Lower back pain   . Dysesthesia   . COPD (chronic obstructive pulmonary disease)   . Allergy   . Hypertension   . GERD (gastroesophageal reflux disease)   . Depression   . Anxiety   . Peripheral vascular disease   . DDD (degenerative disc disease), lumbar     Osteo arthritis  . Lumbar spondylosis   . Atrial fibrillation   . Diabetes mellitus without complication     Past Surgical History  Procedure Laterality Date  . Tonsillectomy      Prior to Admission medications   Medication Sig Start Date End Date Taking? Authorizing Provider  albuterol (PROAIR HFA) 108 (90 BASE) MCG/ACT inhaler Inhale 2 puffs into the lungs every 4 (four) hours as needed for wheezing or shortness of breath.    Yes Historical Provider, MD  ALPRAZolam Duanne Moron) 0.5 MG tablet Take 0.5 mg by mouth 4 (four) times daily.   Yes Historical Provider, MD  cilostazol (PLETAL) 100 MG tablet Take 100 mg by mouth 2 (two) times daily before a meal.   Yes Historical Provider, MD  HYDROcodone-acetaminophen (NORCO) 10-325 MG per tablet Take 1 tablet by mouth every 6 (six) hours as needed for moderate pain or severe pain.    Yes Historical Provider, MD  montelukast (SINGULAIR) 10 MG tablet Take 10 mg by mouth at bedtime.   Yes Historical Provider, MD  omeprazole (PRILOSEC) 20 MG capsule Take 20 mg by mouth 2 (two) times daily.   Yes Historical Provider, MD  tiZANidine (ZANAFLEX) 2 MG tablet Take 2 mg by mouth every 8 (eight) hours as needed for muscle spasms.   Yes Historical Provider, MD    Allergies as of 10/26/2013  . (No Known Allergies)    Family History  Problem Relation Age of Onset  . Cancer Mother   . Diabetes Mother   . Cancer Sister   . Diabetes Sister   . Cancer Sister   . Diabetes Sister    . Colon cancer Neg Hx     History   Social History  . Marital Status: Single    Spouse Name: N/A    Number of Children: N/A  . Years of Education: N/A   Occupational History  . Not on file.   Social History Main Topics  . Smoking status: Current Every Day Smoker -- 1.00 packs/day for 55 years  . Smokeless tobacco: Never Used  . Alcohol Use: 25.2 oz/week    42 Cans of beer per week     Comment: ETOH abuse, 6-12 beers a day  . Drug Use: No  . Sexual Activity: Not on file   Other Topics Concern  . Not on file   Social History Narrative  . No narrative on file    Review of Systems: See HPI, otherwise negative ROS   Physical Exam: BP 129/76  Pulse 90  Temp(Src) 97.6 F (36.4 C) (Oral)  Resp 19  Ht 6\' 1"  (1.854 m)  Wt 144 lb 0.1 oz (65.32 kg)  BMI 19.00 kg/m2  SpO2 94% General:   Alert,  pleasant and cooperative in NAD Head:  Normocephalic and atraumatic. Neck:  Supple; Lungs:  Clear throughout to auscultation.    Heart:  Regular rate and rhythm. Abdomen:  Soft, nontender and nondistended. Normal  bowel sounds, without guarding, and without rebound.   Neurologic:  Alert and  oriented x4;  grossly normal neurologically.  Impression/Plan:    DYSPHAGIA  PLAN:  EGD/?DIL TODAY

## 2013-10-27 NOTE — Discharge Instructions (Signed)
Esophageal Cancer Esophageal cancer occurs when abnormal cells within the esophagus begin to divide rapidly and uncontrollably. The esophagus is the tube that carries food and drink from the throat into the stomach.  CAUSES  The exact cause of esophageal cancer is not known. It is believed that esophageal cancer occurs due to many factors. People are at a higher risk of developing esophageal cancer if they:  Are older than 70 years of age.  Are male.  Smoke or use tobacco.  Drink heavily.  Eat a poor diet (low in fruits and vegetables).  Are overweight (obese).  Have conditions which result in long-standing damage or irritation to the esophagus. These conditions include:  Acid reflux (the stomach acid leaks back up the esophagus, damaging it).  Barrett Esophagus (abnormal cells are found in the lower part of the esophagus, usually due to acid reflux occurring over a long period of time).  Achalasia (the muscles and nerves of the esophagus do not work properly. Food and drink do not move in a normal way down the esophagus and into the stomach).  Esophageal webs (thin strings of tissues grow within the esophagus).  Damage due to toxic exposures (an example would be swallowing a caustic poison such as lye). SYMPTOMS  Symptoms can include:  Trouble swallowing.  Chest or back pain.  Unintentional weight loss.  Severe tiredness (fatigue).  Hoarse voice.  Cough. DIAGNOSIS  Esophageal cancer is usually diagnosed by performing:  Barium swallow. After drinking barium (a liquid that coats the esophagus), a series of X-rays are taken which can reveal abnormalities.  Endoscopic exam. A lighted scope is used to view the inside of the esophagus.  Biopsy. Small pieces of the esophagus are removed for exam in a lab. This can be done through the scope used for an endoscopic exam. TREATMENT  Treatment of esophageal cancer depends on a number of factors, including:   Characteristics  of the cancer cells present.  Tumor size.  Whether the cancer has spread to lymph nodes or to other more distant locations (such as other organs or bone). The types of treatments used for esophageal cancer include:  Surgery to remove as much of the cancer as possible.  Chemotherapy (medicines that kill cancer cells).  Radiation therapy to kill cancer cells.  Combinations of chemotherapy and radiation therapy.  Biological therapy that uses antibodies in ways that can take advantage of the weaknesses of the tumor cells. Your caregivers will also address your needs for pain relief, nutrition, and help with swallowing problems if these develop.  HOME CARE INSTRUCTIONS   Only take over-the-counter or prescription medicines for pain, discomfort or fever as directed by your caregiver.  Maintain a healthy diet. Advice from a nutritionist can be helpful when addressing your specific needs.  Consider joining a support group. This may help you learn to cope with the stress of having esophageal cancer.  Seek advice to help you manage treatment side effects. SEEK MEDICAL CARE IF:  You develop problems with swallowing that are getting worse.  You notice new fatigue or weakness.  You experience unintentional weight loss. SEEK IMMEDIATE MEDICAL CARE IF:  You have a sudden increase in pain.  You have trouble breathing.  You have a fever.  You vomit blood or black material that looks like coffee grounds.  You faint. Document Released: 01/12/2008 Document Revised: 04/23/2011 Document Reviewed: 01/12/2008 ExitCare Patient Information 2015 [employer]. This information is not intended to replace advice given to you by your health   care provider. Make sure you discuss any questions you have with your health care provider.

## 2013-10-27 NOTE — Op Note (Signed)
[facility] [address] [place], [address]   ENDOSCOPY PROCEDURE REPORT  PATIENT: Peter Neal, Peter Neal  MR#: [id number] BIRTHDATE: 02-27-1945 , 66  yrs. old GENDER: Male ENDOSCOPIST: Barney Drain, MD REFERRED UD:THYHO Qureshi, M.D. PROCEDURE DATE: 10/27/2013 PROCEDURE:   EGD w/ biopsy and  w/ fb removal INDICATIONS:Epigastric pain.   Weight loss. MEDICATIONS: Demerol 100 mg IV, Versed 4 mg IV, and PREOP: Promethazine (Phenergan) 12.5mg  IV TOPICAL ANESTHETIC:   Viscous Xylocaine  DESCRIPTION OF PROCEDURE:     Physical exam was performed.  Informed consent was obtained from the patient after explaining the benefits, risks, and alternatives to the procedure.  The patient was connected to the monitor and placed in the left lateral position.  Continuous oxygen was provided by nasal cannula and IV medicine administered through an indwelling cannula.  After administration of sedation, the patients esophagus was intubated and the EG-[id number] ([id number])  endoscope was advanced under direct visualization to the second portion of the duodenum.  The scope was removed slowly by carefully examining the color, texture, anatomy, and integrity of the mucosa on the way out.  The patient was recovered in endoscopy and discharged home in satisfactory condition.   ESOPHAGUS: FOOD IMPACTION IN MID-ESOPHAGUS.  ESOPHAGEAL OVERTUBE INSERTED AND FOOD REMOVED VIA TALON GRASPER.  MASS SEEN FROM 29 CM TO 35 CM FROM THE TEETH AND OCCUPIES 3/4 THE CIRCUMFERENCE OF THE ESOPHAGUS.  ABLE TO PASS DIAGNOSTIC GASTROSCOPE WITHOUT RESISTANCE. COLD FORCEPS BIOPSIES OBTAINED.  TWO SATELLITE LESIONS SEEN 38-40 CM FROM THE TEETH.  NL Z LINE.   STOMACH: Severe non-erosive gastritis (inflammation) was found in the entire examined stomach. Multiple biopsies were performed using cold forceps.   DUODENUM: Moderate duodenal inflammation was found in the bulb and second portion of the duodenum.   A medium sized  diverticulum was found in the 2nd part of the duodenum. COMPLICATIONS:   None  ENDOSCOPIC IMPRESSION: 1.   FOOD IMPACTION IN MID-ESOPHAGUS DUE TO ESOPHAGEAL MASS 2.   SEVERE Non-erosive gastritis AND MODERATE DUODENITIS 3.   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 BID PPI AWAIT BIOPSY ONCOLOGY CONSULT.  DISCUSSED WITH Mr.  Sheldon Silvan.  PT NEED CT ABD/PELVIS W/ IVC OK TO D/C HOME AFTER ONCOLOGY CONSULT.   REPEAT EXAM:   _______________________________ Lorrin MaisBarney Drain, MD 10/27/2013 1:09 PM

## 2013-10-27 NOTE — Consult Note (Signed)
Referring Provider: Dr. Anastasio Champion Primary Care Physician:  Cleda Mccreedy, MD Primary Gastroenterologist:  Dr. Oneida Alar   Date of Admission: 10/26/13 Date of Consultation: 10/27/13  Reason for Consultation:  New-onset Dysphagia  HPI:  Kamden Stanislaw is a 68 year old male with acute onset of dysphagia a week ago. Symptoms worsening, resulting in presentation to the ED. Able to tolerate liquids and saliva. Over past week would have to take very small bites followed by liquid in order to swallow. Occasional regurgitation. Associated odynophagia. Believes he has lost about 10 lbs in the past few months. Poor historian. No nausea or abdominal pain. No melena or hematochezia. Chronic GERD, taking Tums. Prilosec BID on med list, but patient does not believe he was taking. States he was on Protonix in the past.    Colonoscopy several years ago, possibly 5-6 years ago. Does not remember physician who performed this. Believes he may have had an EGD around this time as well.   Past Medical History  Diagnosis Date  . Lower back pain   . Dysesthesia   . COPD (chronic obstructive pulmonary disease)   . Allergy   . Hypertension   . GERD (gastroesophageal reflux disease)   . Depression   . Anxiety   . Peripheral vascular disease   . DDD (degenerative disc disease), lumbar     Osteo arthritis  . Lumbar spondylosis   . Atrial fibrillation   . Diabetes mellitus without complication     Past Surgical History  Procedure Laterality Date  . Tonsillectomy      Prior to Admission medications   Medication Sig Start Date End Date Taking? Authorizing Provider  albuterol (PROAIR HFA) 108 (90 BASE) MCG/ACT inhaler Inhale 2 puffs into the lungs every 4 (four) hours as needed for wheezing or shortness of breath.    Yes Historical Provider, MD  ALPRAZolam Duanne Moron) 0.5 MG tablet Take 0.5 mg by mouth 4 (four) times daily.   Yes Historical Provider, MD  cilostazol (PLETAL) 100 MG tablet Take 100 mg by mouth  2 (two) times daily before a meal.   Yes Historical Provider, MD  HYDROcodone-acetaminophen (NORCO) 10-325 MG per tablet Take 1 tablet by mouth every 6 (six) hours as needed for moderate pain or severe pain.    Yes Historical Provider, MD  montelukast (SINGULAIR) 10 MG tablet Take 10 mg by mouth at bedtime.   Yes Historical Provider, MD  omeprazole (PRILOSEC) 20 MG capsule Take 20 mg by mouth 2 (two) times daily.   Yes Historical Provider, MD  tiZANidine (ZANAFLEX) 2 MG tablet Take 2 mg by mouth every 8 (eight) hours as needed for muscle spasms.   Yes Historical Provider, MD    Current Facility-Administered Medications  Medication Dose Route Frequency Provider Last Rate Last Dose  . 0.9 %  sodium chloride infusion   Intravenous Continuous Babette Relic, MD 125 mL/hr at 10/27/13 [phone]    . albuterol (PROVENTIL) (2.5 MG/3ML) 0.083% nebulizer solution 3 mL  3 mL Inhalation Q4H PRN Nimish Luther Parody, MD      . ALPRAZolam Duanne Moron) tablet 0.5 mg  0.5 mg Oral QID Nimish C Anastasio Champion, MD   0.5 mg at 10/26/13 2200  . cilostazol (PLETAL) tablet 100 mg  100 mg Oral BID AC Nimish C Gosrani, MD      . HYDROcodone-acetaminophen (NORCO) 10-325 MG per tablet 1 tablet  1 tablet Oral Q6H PRN Doree Albee, MD   1 tablet at 10/27/13 0520  .  montelukast (SINGULAIR) tablet 10 mg  10 mg Oral QHS Nimish C Gosrani, MD      . nicotine (NICODERM CQ - dosed in mg/24 hours) patch 21 mg  21 mg Transdermal Daily Kathie Dike, MD      . ondansetron (ZOFRAN) tablet 4 mg  4 mg Oral Q6H PRN Nimish C Gosrani, MD       Or  . ondansetron (ZOFRAN) injection 4 mg  4 mg Intravenous Q6H PRN Nimish C Gosrani, MD      . pantoprazole (PROTONIX) EC tablet 40 mg  40 mg Oral Daily Nimish C Anastasio Champion, MD   40 mg at 10/26/13 1946  . promethazine (PHENERGAN) injection 12.5 mg  12.5 mg Intravenous Once Danie Binder, MD      . tiZANidine (ZANAFLEX) tablet 2 mg  2 mg Oral Q8H PRN Doree Albee, MD        Allergies as of 10/26/2013  . (No  Known Allergies)    Family History  Problem Relation Age of Onset  . Cancer Mother   . Diabetes Mother   . Cancer Sister   . Diabetes Sister   . Cancer Sister   . Diabetes Sister   . Colon cancer Neg Hx     History   Social History  . Marital Status: Single    Spouse Name: N/A    Number of Children: N/A  . Years of Education: N/A   Occupational History  . Not on file.   Social History Main Topics  . Smoking status: Current Every Day Smoker -- 1.00 packs/day for 55 years  . Smokeless tobacco: Never Used  . Alcohol Use: 25.2 oz/week    42 Cans of beer per week     Comment: ETOH abuse, 6-12 beers a day  . Drug Use: No  . Sexual Activity: Not on file   Other Topics Concern  . Not on file   Social History Narrative  . No narrative on file    Review of Systems: Gen: Denies fever, chills, loss of appetite, change in weight or weight loss CV: Denies chest pain, heart palpitations, syncope, edema  Resp: +DOE GI: see HPI GU : Denies urinary burning, urinary frequency, urinary incontinence.  MS: right leg/hip pain Derm: Denies rash, itching, dry skin Psych: Denies depression, anxiety,confusion, or memory loss Heme: Denies bruising, bleeding, and enlarged lymph nodes.  Physical Exam: Vital signs in last 24 hours: Temp:  [97.6 F (36.4 C)-98.7 F (37.1 C)] 97.7 F (36.5 C) (09/15 0424) Pulse Rate:  [77-112] 102 (09/15 0424) Resp:  [18-20] 18 (09/15 0424) BP: (116-141)/(71-102) 132/71 mmHg (09/15 0424) SpO2:  [98 %-100 %] 98 % (09/15 0424) Weight:  [144 lb 0.1 oz (65.32 kg)-144 lb 4 oz (65.431 kg)] 144 lb 0.1 oz (65.32 kg) (09/14 2006) Last BM Date: 10/23/13 General:   Alert,  Appears older than stated age Head:  Normocephalic and atraumatic. Eyes:  Sclera clear, no icterus.   Conjunctiva pink. Ears:  Mild HOH Nose:  No deformity, discharge,  or lesions. Mouth:  No deformity or lesions, edentulous Lungs:  Scattered rhonchi, coarse, mild expiratory wheeze  bilaterally Heart:  S1 S2 present; no murmurs appreciated Abdomen:  Soft, nontender and nondistended. No masses, hepatosplenomegaly or hernias noted. Normal bowel sounds, without guarding, and without rebound.   Rectal:  Deferred  Msk:  Symmetrical without gross deformities. Normal posture. Extremities:  Without edema. Neurologic:  Alert and  oriented x4;  grossly normal neurologically. Skin:  Intact without  significant lesions or rashes. Psych:  Alert and cooperative. Normal mood and affect.  Intake/Output from previous day: 09/14 0701 - 09/15 0700 In: -  Out: 300 [Urine:300] Intake/Output this shift:    Lab Results:  Recent Labs  10/26/13 1621 10/27/13 0522  WBC 10.5 8.6  HGB 16.4 14.7  HCT 47.2 43.1  PLT 253 242   BMET  Recent Labs  10/26/13 1621 10/27/13 0522  NA 139 141  K 3.5* 3.9  CL 97 101  CO2 26 29  GLUCOSE 109* 92  BUN 13 13  CREATININE 0.86 0.79  CALCIUM 10.0 9.2   LFT  Recent Labs  10/26/13 1621 10/27/13 0522  PROT 8.2 7.0  ALBUMIN 3.3* 2.8*  AST 17 14  ALT 11 9  ALKPHOS 125* 108  BILITOT 0.5 0.5    Studies/Results: Dg Chest 2 View  10/26/2013   CLINICAL DATA:  Difficulty swallowing x1 week  EXAM: CHEST  2 VIEW  COMPARISON:  None.  FINDINGS: Chronic interstitial markings. No focal consolidation. No pleural effusion or pneumothorax.  The heart is normal in size.  Mild degenerative changes of the visualized thoracolumbar spine.  IMPRESSION: No evidence of acute cardiopulmonary disease.   Electronically Signed   By: Julian Hy M.D.   On: 10/26/2013 18:54    Impression: 68 year old male with new onset dysphagia, odynophagia, and self-reported weight loss in the setting of chronic GERD. Possible EGD in remote past; patient poor historian. Concern for esophagitis, stricture, malignancy. Able to swallow his own secretions currently. Now NPO for endoscopy with dilation later this morning. Will need Phenergan 12.5 mg IV on call due to history  of ETOH abuse.   Plan: EGD with dilation with Dr. Oneida Alar today.  Phenergan on call for procedure NPO Agree with PPI daily ETOH cessation Further recommendations to follow   Orvil Feil, ANP-BC [facility]     LOS: 1 day    10/27/2013, 9:04 AM

## 2013-10-27 NOTE — Consult Note (Signed)
REVIEWED-NO ADDITIONAL RECOMMENDATIONS. 

## 2013-10-27 NOTE — Consult Note (Signed)
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  HISTORY OF PRESENT ILLNESS:   Peter Neal is a chronically ill appearing 68 yo white man who has a past medical history significant for PVD, tobacco abuse, and EtOH abuse who presented to the [facility] ED on 9/14 with emesis and dysphagia.    He reports problems swallowing solid foods x 1-2 weeks.  This has led to a rapid weight loss of 10 lbs x 1 month, but over the past few months (patient unable to quantify for me) he has lost 40 lbs.  Due to his dysphagia complaint, an EGD was performed by Dr. Oneida Alar today revealing a 3/4 circumferential mass, worrisome for malignancy.  Biopsies were taken and results are pending.  Peter Neal admits to a 3 ppd smoking history x 60 years, more recently he is smoking 1/2 ppd.  Additionally, he notes a 12-24 beer per day drinking history.  I personally reviewed and went over laboratory results with the patient.  The results are noted within this dictation.  I personally reviewed and went over radiographic studies with the patient.  The results are noted within this dictation.    Chart reviewed.  Other than the aforementioned, he denies any oncology complaints and ROS questioning is negative.  He reports that he can quit drinking cold [place] if needed.  PAST MEDICAL HISTORY:   Past Medical History  Diagnosis Date  . Lower back pain   . Dysesthesia   . COPD (chronic obstructive pulmonary disease)   . Allergy   . Hypertension   . GERD (gastroesophageal reflux disease)   . Depression   . Anxiety   . Peripheral vascular disease   . DDD (degenerative disc disease), lumbar     Osteo arthritis  . Lumbar spondylosis   . Atrial fibrillation   .  Diabetes mellitus without complication     ALLERGIES: No Known Allergies    MEDICATIONS: I have reviewed the patient's current medications.     PAST SURGICAL HISTORY Past Surgical History  Procedure Laterality Date  . Tonsillectomy      FAMILY HISTORY: Family History  Problem Relation Age of Onset  . Cancer Mother   . Diabetes Mother   . Cancer Sister   . Diabetes Sister   . Cancer Sister   . Diabetes Sister   . Colon cancer Neg Hx     SOCIAL HISTORY:  reports that he has been smoking.  He has never used smokeless tobacco. He reports that he drinks about 25.2 ounces of alcohol per week. He reports that he does not use illicit drugs.  PERFORMANCE STATUS: The patient's performance status is 2 - Symptomatic, <50% confined to bed  PHYSICAL EXAM: Most Recent Vital Signs: Blood pressure 111/74, pulse 90, temperature 96.8 F (36 C), temperature source Axillary, resp. rate 18, height 6' 1"  (1.854 m), weight 144 lb 0.1 oz (65.32 kg), SpO2 92.00%. General appearance: alert, cooperative, appears older than stated age, no distress and chronically ill appearing Head: Normocephalic, without obvious abnormality, atraumatic Eyes: conjunctivae/corneas clear. PERRL, EOM's intact. Fundi benign. Throat: normal findings: lips normal without lesions, tongue midline and normal and soft palate, uvula, and tonsils normal Neck: supple, symmetrical, trachea midline Lungs: clear to auscultation  bilaterally Heart: regular rate and rhythm Abdomen: soft, non-tender; bowel sounds normal; no masses,  no organomegaly Skin: Skin color, texture, turgor normal. No rashes or lesions Neurologic: Grossly normal  LABORATORY DATA:  Results for orders placed during the hospital encounter of 10/26/13 (from the past 48 hour(s))  CBC WITH DIFFERENTIAL     Status: Abnormal   Collection Time    10/26/13  4:21 PM      Result Value Ref Range   WBC 10.5  4.0 - 10.5 K/uL   RBC 4.83  4.22 - 5.81 MIL/uL   Hemoglobin  16.4  13.0 - 17.0 g/dL   HCT 47.2  39.0 - 52.0 %   MCV 97.7  78.0 - 100.0 fL   MCH 34.0  26.0 - 34.0 pg   MCHC 34.7  30.0 - 36.0 g/dL   RDW 13.5  11.5 - 15.5 %   Platelets 253  150 - 400 K/uL   Neutrophils Relative % 68  43 - 77 %   Neutro Abs 7.1  1.7 - 7.7 K/uL   Lymphocytes Relative 19  12 - 46 %   Lymphs Abs 2.0  0.7 - 4.0 K/uL   Monocytes Relative 12  3 - 12 %   Monocytes Absolute 1.3 (*) 0.1 - 1.0 K/uL   Eosinophils Relative 1  0 - 5 %   Eosinophils Absolute 0.1  0.0 - 0.7 K/uL   Basophils Relative 0  0 - 1 %   Basophils Absolute 0.0  0.0 - 0.1 K/uL  COMPREHENSIVE METABOLIC PANEL     Status: Abnormal   Collection Time    10/26/13  4:21 PM      Result Value Ref Range   Sodium 139  137 - 147 mEq/L   Potassium 3.5 (*) 3.7 - 5.3 mEq/L   Chloride 97  96 - 112 mEq/L   CO2 26  19 - 32 mEq/L   Glucose, Bld 109 (*) 70 - 99 mg/dL   BUN 13  6 - 23 mg/dL   Creatinine, Ser 0.86  0.50 - 1.35 mg/dL   Calcium 10.0  8.4 - 10.5 mg/dL   Total Protein 8.2  6.0 - 8.3 g/dL   Albumin 3.3 (*) 3.5 - 5.2 g/dL   AST 17  0 - 37 U/L   ALT 11  0 - 53 U/L   Alkaline Phosphatase 125 (*) 39 - 117 U/L   Total Bilirubin 0.5  0.3 - 1.2 mg/dL   GFR calc non Af Amer 86 (*) >90 mL/min   GFR calc Af Amer >90  >90 mL/min   Comment: (NOTE)     The eGFR has been calculated using the CKD EPI equation.     This calculation has not been validated in all clinical situations.     eGFR's persistently <90 mL/min signify possible Chronic Kidney     Disease.   Anion gap 16 (*) 5 - 15  COMPREHENSIVE METABOLIC PANEL     Status: Abnormal   Collection Time    10/27/13  5:22 AM      Result Value Ref Range   Sodium 141  137 - 147 mEq/L   Potassium 3.9  3.7 - 5.3 mEq/L   Chloride 101  96 - 112 mEq/L   CO2 29  19 - 32 mEq/L   Glucose, Bld 92  70 - 99 mg/dL   BUN 13  6 - 23 mg/dL   Creatinine, Ser 0.79  0.50 - 1.35 mg/dL   Calcium  9.2  8.4 - 10.5 mg/dL   Total Protein 7.0  6.0 - 8.3 g/dL   Albumin 2.8 (*) 3.5 - 5.2  g/dL   AST 14  0 - 37 U/L   ALT 9  0 - 53 U/L   Alkaline Phosphatase 108  39 - 117 U/L   Total Bilirubin 0.5  0.3 - 1.2 mg/dL   GFR calc non Af Amer 89 (*) >90 mL/min   GFR calc Af Amer >90  >90 mL/min   Comment: (NOTE)     The eGFR has been calculated using the CKD EPI equation.     This calculation has not been validated in all clinical situations.     eGFR's persistently <90 mL/min signify possible Chronic Kidney     Disease.   Anion gap 11  5 - 15  CBC     Status: None   Collection Time    10/27/13  5:22 AM      Result Value Ref Range   WBC 8.6  4.0 - 10.5 K/uL   RBC 4.37  4.22 - 5.81 MIL/uL   Hemoglobin 14.7  13.0 - 17.0 g/dL   HCT 43.1  39.0 - 52.0 %   MCV 98.6  78.0 - 100.0 fL   MCH 33.6  26.0 - 34.0 pg   MCHC 34.1  30.0 - 36.0 g/dL   RDW 13.6  11.5 - 15.5 %   Platelets 242  150 - 400 K/uL      RADIOGRAPHY: Dg Chest 2 View  10/26/2013   CLINICAL DATA:  Difficulty swallowing x1 week  EXAM: CHEST  2 VIEW  COMPARISON:  None.  FINDINGS: Chronic interstitial markings. No focal consolidation. No pleural effusion or pneumothorax.  The heart is normal in size.  Mild degenerative changes of the visualized thoracolumbar spine.  IMPRESSION: No evidence of acute cardiopulmonary disease.   Electronically Signed   By: Julian Hy M.D.   On: 10/26/2013 18:54       PATHOLOGY:  Pending   ASSESSMENT:  1. 3/4 Circumferential esophageal mass, S/P EGD with biopsies by Dr. Oneida Alar on 10/27/2013.  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 2. EtOHism, drinking 12-24 beers/day 3. Tobacco abuse, smoking 3 ppd x 60 years, most recently smoking 1/2 ppd 4. PVD  Patient Active Problem List   Diagnosis Date Noted  . Dysphagia 10/26/2013  . Peripheral vascular disease, unspecified 08/03/2013     PLAN:  1. I personally reviewed and went over laboratory results with the patient.  The results are noted within this dictation. 2. I personally reviewed and went over  radiographic studies with the patient.  The results are noted within this dictation.   3. Chart reviewed 4. CT CAP with contrast today for staging. 5. Possible outpatient PET scan  6. Outpatient EUS if no metastatic disease is found on CT imaging. 7. Treatment will be based upon histology, HER2 status (if adenocarcinoma), and extent of disease, while keeping his other significant co-morbidities in mind when make treatment selections. 8. Depending on proposed treatment and diagnosis, PEG/G-Tube may be needed. 9. Outpatient follow-up appointment at the [facility] on 11/03/2013 at 1:30 PM to see Dr. Barnet Glasgow.  All questions were answered. The patient knows to call the clinic with any problems, questions or concerns. We can certainly see the patient much sooner if necessary.  Patient and plan discussed with Dr. Farrel Gobble and he is in agreement with the aforementioned.   KEFALAS,THOMAS 10/27/2013

## 2013-10-27 NOTE — Discharge Summary (Signed)
Physician Discharge Summary  Peter Neal XKG:[id number] DOB: [birth year] DOA: 10/26/2013  PCP: Cleda Mccreedy, MD  Admit date: 10/26/2013 Discharge date: 10/27/2013  Time spent: 40 minutes  Recommendations for Outpatient Follow-up:  1. Patient has been set up with oncology clinic to follow up and discuss biopsy results  Discharge Diagnoses:  Principal Problem:   Dysphagia Active Problems:   Esophageal mass   Tobacco use disorder   Alcohol abuse   Discharge Condition: stable  Diet recommendation: low salt, puree diet/full liquids  Filed Weights   10/26/13 1546 10/26/13 2006  Weight: 65.431 kg (144 lb 4 oz) 65.32 kg (144 lb 0.1 oz)    History of present illness and hospital course:  This patient was admitted to the hospital with dysphagia. He also reported unintentional weight loss over the last several months. The patient smokes heavily and drinks 12-24 beers per day. He was evaluated by gastroenterology and underwent EGD. Results are as below. The possibility of esophageal Mass, biopsies were done oncology was consulted. Recommendations were for CT scan of the chest abdomen and pelvis. Patient will subsequently followup with oncology in the outpatient clinic to discuss biopsy results and further treatment plans. He is able to tolerate liquids/pured food at this time. Since he was found to have significant gastritis, he was advised to continue with PPIs twice a day and avoid further alcohol consumption. He's been advised to quit smoking. Patient is eager to discharge home today. He'll be discharged after his CT scans are completed.  Procedures: EGD: 1. FOOD IMPACTION IN MID-ESOPHAGUS DUE TO ESOPHAGEAL MASS  2. SEVERE Non-erosive gastritis AND MODERATE DUODENITIS  3. 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 If you have any questions about your discharge medications or the care you received while you were in the hospital after you are discharged, you can call the unit and asked to speak with the hospitalist on call if the hospitalist that took care of you is not available. Once you are discharged, your primary care physician will handle any further medical issues. Please note that NO REFILLS for any discharge medications will be authorized once you are discharged, as it is imperative that you return to your primary care physician (or establish a relationship with a primary care physician if you do not have one) for your aftercare needs so that they can reassess your need for medications and monitor your lab values.  Discharge Instructions   Call MD for:  persistant nausea and vomiting    Complete by:  As directed      Call MD for:  severe uncontrolled pain    Complete by:  As directed      Call MD for:  temperature >100.4    Complete by:  As directed      Diet - low sodium heart healthy    Complete by:  As directed   Pureed diet or full liquids     Increase activity slowly    Complete by:  As directed           Current Discharge Medication List    CONTINUE these medications which have NOT CHANGED  Details  albuterol (PROAIR HFA) 108 (90 BASE) MCG/ACT inhaler Inhale 2 puffs into the lungs every 4 (four) hours as needed for wheezing or shortness of breath.     ALPRAZolam (XANAX) 0.5 MG tablet Take 0.5 mg by mouth 4 (four) times daily.    cilostazol (PLETAL) 100 MG tablet Take 100 mg by mouth 2 (two) times daily before a meal.    HYDROcodone-acetaminophen (NORCO) 10-325 MG per tablet Take 1 tablet by mouth every 6 (six) hours as needed for moderate pain or severe pain.     montelukast (SINGULAIR) 10 MG tablet Take 10 mg by mouth  at bedtime.    omeprazole (PRILOSEC) 20 MG capsule Take 20 mg by mouth 2 (two) times daily.    tiZANidine (ZANAFLEX) 2 MG tablet Take 2 mg by mouth every 8 (eight) hours as needed for muscle spasms.       No Known Allergies Follow-up Information   Follow up with [facility] On 11/03/2013. (at 1:30 PM)    Contact information:   [address] [place] [phone]        The results of significant diagnostics from this hospitalization (including imaging, microbiology, ancillary and laboratory) are listed below for reference.    Significant Diagnostic Studies: Dg Chest 2 View  10/26/2013   CLINICAL DATA:  Difficulty swallowing x1 week  EXAM: CHEST  2 VIEW  COMPARISON:  None.  FINDINGS: Chronic interstitial markings. No focal consolidation. No pleural effusion or pneumothorax.  The heart is normal in size.  Mild degenerative changes of the visualized thoracolumbar spine.  IMPRESSION: No evidence of acute cardiopulmonary disease.   Electronically Signed   By: Julian Hy M.D.   On: 10/26/2013 18:54    Microbiology: No results found for this or any previous visit (from the past 240 hour(s)).   Labs: Basic Metabolic Panel:  Recent Labs Lab 10/26/13 1621 10/27/13 0522  NA 139 141  K 3.5* 3.9  CL 97 101  CO2 26 29  GLUCOSE 109* 92  BUN 13 13  CREATININE 0.86 0.79  CALCIUM 10.0 9.2   Liver Function Tests:  Recent Labs Lab 10/26/13 1621 10/27/13 0522  AST 17 14  ALT 11 9  ALKPHOS 125* 108  BILITOT 0.5 0.5  PROT 8.2 7.0  ALBUMIN 3.3* 2.8*   No results found for this basename: LIPASE, AMYLASE,  in the last 168 hours No results found for this basename: AMMONIA,  in the last 168 hours CBC:  Recent Labs Lab 10/26/13 1621 10/27/13 0522  WBC 10.5 8.6  NEUTROABS 7.1  --   HGB 16.4 14.7  HCT 47.2 43.1  MCV 97.7 98.6  PLT 253 242   Cardiac Enzymes: No results found for this basename: CKTOTAL, CKMB, CKMBINDEX, TROPONINI,  in the last  168 hours BNP: BNP (last 3 results) No results found for this basename: PROBNP,  in the last 8760 hours CBG: No results found for this basename: GLUCAP,  in the last 168 hours     Signed:  MEMON,JEHANZEB  [facility] 10/27/2013, 4:58 PM

## 2013-10-27 NOTE — Progress Notes (Signed)
Pt discharged home with Amy Conley Simmonds

## 2013-10-29 ENCOUNTER — Encounter (HOSPITAL_COMMUNITY): Payer: Self-pay | Admitting: Gastroenterology

## 2013-10-30 ENCOUNTER — Encounter: Payer: Self-pay | Admitting: Surgery

## 2013-11-02 ENCOUNTER — Ambulatory Visit: Payer: Medicare Other | Admitting: Surgery

## 2013-11-02 ENCOUNTER — Ambulatory Visit (INDEPENDENT_AMBULATORY_CARE_PROVIDER_SITE_OTHER): Payer: Medicare Other | Admitting: Surgery

## 2013-11-02 ENCOUNTER — Encounter: Payer: Self-pay | Admitting: Surgery

## 2013-11-02 VITALS — BP 127/85 | HR 97 | Ht 73.0 in | Wt 144.0 lb

## 2013-11-02 DIAGNOSIS — I739 Peripheral vascular disease, unspecified: Secondary | ICD-10-CM | POA: Diagnosis not present

## 2013-11-02 NOTE — Progress Notes (Signed)
HISTORY AND PHYSICAL     CC:  Right leg pain  Cleda Mccreedy, MD  HPI: This is a 68 y.o. male who presented 3 months ago for right leg claudication.  The pt did have a hx of low back pain with radicular pain into the right extremity.  L4-L5 epidural injection did help with his back pain, but continued to have significant symptoms in the right leg, which he described as being on the lateral surface of the leg from his hip to his knee.  He denies any claudication in his calf or thigh.  At that time, he was placed on Pletal.  He states that this medication has helped his pain and it is much more tolerable now.    He was smoking 3 ppd of cigarettes, but at his last visit, he was down to about 0.5 ppd.  Today he states he is only smoking 4-6 cigarettes per day and has completely quit drinking.  He states that he has been diagnosed with esophageal cancer since his last visit.  Past Medical History  Diagnosis Date  . Lower back pain   . Dysesthesia   . COPD (chronic obstructive pulmonary disease)   . Allergy   . Hypertension   . GERD (gastroesophageal reflux disease)   . Depression   . Anxiety   . Peripheral vascular disease   . DDD (degenerative disc disease), lumbar     Osteo arthritis  . Lumbar spondylosis   . Atrial fibrillation   . Diabetes mellitus without complication   . Cancer    Past Surgical History  Procedure Laterality Date  . Tonsillectomy    . Esophagogastroduodenoscopy N/A 10/27/2013    Procedure: ESOPHAGOGASTRODUODENOSCOPY (EGD);  Surgeon: Danie Binder, MD;  Location: [facility];  Service: Endoscopy;  Laterality: N/A;  WITH DILATION. GIVE PHENERGAN 12.5 MG IV ON CALL    No Known Allergies  Current Outpatient Prescriptions  Medication Sig Dispense Refill  . albuterol (PROAIR HFA) 108 (90 BASE) MCG/ACT inhaler Inhale 2 puffs into the lungs every 4 (four) hours as needed for wheezing or shortness of breath.       . ALPRAZolam (XANAX) 0.5 MG tablet Take 0.5 mg  by mouth 4 (four) times daily.      . cilostazol (PLETAL) 100 MG tablet Take 100 mg by mouth 2 (two) times daily before a meal.      . HYDROcodone-acetaminophen (NORCO) 10-325 MG per tablet Take 1 tablet by mouth every 6 (six) hours as needed for moderate pain or severe pain.       . montelukast (SINGULAIR) 10 MG tablet Take 10 mg by mouth at bedtime.      Marland Kitchen omeprazole (PRILOSEC) 20 MG capsule Take 20 mg by mouth 2 (two) times daily.      Marland Kitchen tiZANidine (ZANAFLEX) 2 MG tablet Take 2 mg by mouth every 8 (eight) hours as needed for muscle spasms.       No current facility-administered medications for this visit.    Family History  Problem Relation Age of Onset  . Cancer Mother   . Diabetes Mother   . Cancer Sister   . Diabetes Sister   . Cancer Sister   . Diabetes Sister   . Colon cancer Neg Hx     History   Social History  . Marital Status: Single    Spouse Name: N/A    Number of Children: N/A  . Years of Education: N/A   Occupational History  . Not  on file.   Social History Main Topics  . Smoking status: Current Every Day Smoker -- 1.00 packs/day for 55 years    Types: Cigarettes  . Smokeless tobacco: Never Used  . Alcohol Use: No     Comment: ETOH abuse, 6-12 beers a day  . Drug Use: No  . Sexual Activity: Not on file   Other Topics Concern  . Not on file   Social History Narrative  . No narrative on file     ROS: [x]  Positive   [ ]  Negative   [ ]  All sytems reviewed and are negative  Cardiovascular: []  chest pain/pressure []  palpitations []  SOB lying flat []  DOE [x]  pain in legs while walking-right []  pain in feet when lying flat []  hx of DVT []  hx of phlebitis []  swelling in legs []  varicose veins  Pulmonary: [x]  productive cough []  asthma []  wheezing  Neurologic: [x]  weakness in []  arms []  legs []  numbness in []  arms []  legs [] difficulty speaking or slurred speech []  temporary loss of vision in one eye []  dizziness  Hematologic: []   bleeding problems []  problems with blood clotting easily  GI []  vomiting blood []  blood in stool  GU: []  burning with urination []  blood in urine  Psychiatric: []  hx of major depression  Integumentary: []  rashes []  ulcers  Constitutional: []  fever []  chills   PHYSICAL EXAMINATION:  Filed Vitals:   11/02/13 1439  BP: 127/85  Pulse: 97   Body mass index is 19 kg/(m^2).  General:  WDWN in NAD Gait: slow HENT: WNL, normocephalic Eyes: Pupils equal Pulmonary: normal non-labored breathing Skin: without rashes, without ulcers  Vascular Exam/Pulses: Popliteal Unable to palpate  Unable to palpate   DP Unable to palpate  Unable to palpate   PT Unable to palpate  Unable to palpate    Extremities: without ischemic changes, without Gangrene , without cellulitis; without open wounds;  Musculoskeletal: no muscle wasting or atrophy  Neurologic: A&O X 3; Appropriate Affect ; SENSATION: normal; MOTOR FUNCTION:  moving all extremities equally. Speech is fluent/normal   Non-Invasive Vascular Imaging:  None today  Pt meds includes: Statin:  No. Beta Blocker:  No. Aspirin:  No. ACEI:  No. ARB:  No. Other Antiplatelet/Anticoagulant:  Yes.  -Pletal   ASSESSMENT/PLAN:: 68 y.o. male with right leg pain   -pt presents today for follow up after being started on Pletal.  He states that he has had marked improvement in the pain and it is more tolerable.   -He does have a doctor's appt tomorrow for follow up on his recent diagnosis of esophageal cancer. -he had cut back on his cigarette smoking since his last visit and is now down to 4-6 cigarettes per day.  He has quit Etoh.  -he will follow up with Dr. Trula Slade in 6 months to reassess his right lower extremity pain.  He will contact us sooner if his pain increases or he gets a sore on his feet that will not heal.  He expresses understanding of this.   Leontine Locket, PA-C [facility] [phone]  Clinic MD:  Pt seen and examined in conjunction with Dr. Trula Slade  I agree with the above.  I have seen and examined the patient.  His claudication symptoms in the right leg did improve with the addition of cilostazol.  He now feels that his symptoms are tolerable.  In the interval since I last saw him, he has been diagnosed with esophageal cancer.  He is scheduled for further discussion regarding treatment of this tomorrow.  I would not recommend any intervention on his peripheral vascular disease at this point as I do not think that his symptoms are severe enough to warrant intervention.  Therefore, I have scheduled him to follow back up in 6 months.  Annamarie Major

## 2013-11-03 ENCOUNTER — Encounter (HOSPITAL_COMMUNITY): Payer: Self-pay

## 2013-11-03 ENCOUNTER — Telehealth: Payer: Self-pay

## 2013-11-03 ENCOUNTER — Encounter (HOSPITAL_COMMUNITY): Payer: Medicare Other | Attending: Hematology and Oncology

## 2013-11-03 VITALS — BP 141/94 | HR 103 | Temp 97.9°F | Resp 18 | Wt 143.9 lb

## 2013-11-03 DIAGNOSIS — J449 Chronic obstructive pulmonary disease, unspecified: Secondary | ICD-10-CM | POA: Diagnosis not present

## 2013-11-03 DIAGNOSIS — I4891 Unspecified atrial fibrillation: Secondary | ICD-10-CM

## 2013-11-03 DIAGNOSIS — I10 Essential (primary) hypertension: Secondary | ICD-10-CM

## 2013-11-03 DIAGNOSIS — C7800 Secondary malignant neoplasm of unspecified lung: Secondary | ICD-10-CM

## 2013-11-03 DIAGNOSIS — F17200 Nicotine dependence, unspecified, uncomplicated: Secondary | ICD-10-CM | POA: Diagnosis not present

## 2013-11-03 DIAGNOSIS — J438 Other emphysema: Secondary | ICD-10-CM

## 2013-11-03 DIAGNOSIS — Z79899 Other long term (current) drug therapy: Secondary | ICD-10-CM | POA: Diagnosis not present

## 2013-11-03 DIAGNOSIS — C787 Secondary malignant neoplasm of liver and intrahepatic bile duct: Secondary | ICD-10-CM | POA: Insufficient documentation

## 2013-11-03 DIAGNOSIS — E43 Unspecified severe protein-calorie malnutrition: Secondary | ICD-10-CM

## 2013-11-03 DIAGNOSIS — M5137 Other intervertebral disc degeneration, lumbosacral region: Secondary | ICD-10-CM | POA: Diagnosis not present

## 2013-11-03 DIAGNOSIS — E46 Unspecified protein-calorie malnutrition: Secondary | ICD-10-CM | POA: Diagnosis not present

## 2013-11-03 DIAGNOSIS — I739 Peripheral vascular disease, unspecified: Secondary | ICD-10-CM | POA: Diagnosis not present

## 2013-11-03 DIAGNOSIS — K219 Gastro-esophageal reflux disease without esophagitis: Secondary | ICD-10-CM | POA: Diagnosis not present

## 2013-11-03 DIAGNOSIS — J4489 Other specified chronic obstructive pulmonary disease: Secondary | ICD-10-CM | POA: Insufficient documentation

## 2013-11-03 DIAGNOSIS — C159 Malignant neoplasm of esophagus, unspecified: Secondary | ICD-10-CM | POA: Diagnosis not present

## 2013-11-03 DIAGNOSIS — R209 Unspecified disturbances of skin sensation: Secondary | ICD-10-CM | POA: Insufficient documentation

## 2013-11-03 DIAGNOSIS — E119 Type 2 diabetes mellitus without complications: Secondary | ICD-10-CM | POA: Insufficient documentation

## 2013-11-03 DIAGNOSIS — C7801 Secondary malignant neoplasm of right lung: Secondary | ICD-10-CM

## 2013-11-03 DIAGNOSIS — F411 Generalized anxiety disorder: Secondary | ICD-10-CM | POA: Diagnosis not present

## 2013-11-03 DIAGNOSIS — C7802 Secondary malignant neoplasm of left lung: Secondary | ICD-10-CM

## 2013-11-03 DIAGNOSIS — M51379 Other intervertebral disc degeneration, lumbosacral region without mention of lumbar back pain or lower extremity pain: Secondary | ICD-10-CM | POA: Insufficient documentation

## 2013-11-03 LAB — CBC WITH DIFFERENTIAL/PLATELET
BASOS PCT: 1 % (ref 0–1)
Basophils Absolute: 0.1 10*3/uL (ref 0.0–0.1)
Eosinophils Absolute: 0.2 10*3/uL (ref 0.0–0.7)
Eosinophils Relative: 2 % (ref 0–5)
HEMATOCRIT: 43.2 % (ref 39.0–52.0)
HEMOGLOBIN: 15.4 g/dL (ref 13.0–17.0)
LYMPHS ABS: 2 10*3/uL (ref 0.7–4.0)
Lymphocytes Relative: 23 % (ref 12–46)
MCH: 34 pg (ref 26.0–34.0)
MCHC: 35.6 g/dL (ref 30.0–36.0)
MCV: 95.4 fL (ref 78.0–100.0)
MONO ABS: 0.6 10*3/uL (ref 0.1–1.0)
MONOS PCT: 6 % (ref 3–12)
NEUTROS ABS: 6.1 10*3/uL (ref 1.7–7.7)
NEUTROS PCT: 68 % (ref 43–77)
Platelets: 328 10*3/uL (ref 150–400)
RBC: 4.53 MIL/uL (ref 4.22–5.81)
RDW: 12.8 % (ref 11.5–15.5)
WBC: 8.9 10*3/uL (ref 4.0–10.5)

## 2013-11-03 LAB — COMPREHENSIVE METABOLIC PANEL
ALT: 15 U/L (ref 0–53)
AST: 22 U/L (ref 0–37)
Albumin: 3.1 g/dL — ABNORMAL LOW (ref 3.5–5.2)
Alkaline Phosphatase: 171 U/L — ABNORMAL HIGH (ref 39–117)
Anion gap: 14 (ref 5–15)
BUN: 11 mg/dL (ref 6–23)
CALCIUM: 9.7 mg/dL (ref 8.4–10.5)
CO2: 27 mEq/L (ref 19–32)
Chloride: 94 mEq/L — ABNORMAL LOW (ref 96–112)
Creatinine, Ser: 0.78 mg/dL (ref 0.50–1.35)
GFR calc Af Amer: >90 mL/min (ref >90)
GFR calc non Af Amer: 89 mL/min — ABNORMAL LOW (ref >90)
Glucose, Bld: 96 mg/dL (ref 70–99)
POTASSIUM: 3.6 meq/L — AB (ref 3.7–5.3)
Sodium: 135 mEq/L — ABNORMAL LOW (ref 137–147)
TOTAL PROTEIN: 8.4 g/dL — AB (ref 6.0–8.3)
Total Bilirubin: 0.4 mg/dL (ref 0.3–1.2)

## 2013-11-03 LAB — LACTATE DEHYDROGENASE: LDH: 172 U/L (ref 94–250)

## 2013-11-03 NOTE — Patient Instructions (Signed)
[facility] Discharge Instructions  RECOMMENDATIONS MADE BY THE CONSULTANT AND ANY TEST RESULTS WILL BE SENT TO YOUR REFERRING PHYSICIAN.  EXAM FINDINGS BY THE PHYSICIAN TODAY AND SIGNS OR SYMPTOMS TO REPORT TO CLINIC OR PRIMARY PHYSICIAN: You saw Dr Barnet Glasgow today  Follow-up in 2 weeks with the doctor.  Please let us know your decision within a week.  If you have any questions or concerns please call the clinic.  Information given on 85fu and cisplatin.  Thank you for choosing [facility] to provide your oncology and hematology care.  To afford each patient quality time with our providers, please arrive at least 15 minutes before your scheduled appointment time.  With your help, our goal is to use those 15 minutes to complete the necessary work-up to ensure our physicians have the information they need to help with your evaluation and healthcare recommendations.    Effective January 1st, 2014, we ask that you re-schedule your appointment with our physicians should you arrive 10 or more minutes late for your appointment.  We strive to give you quality time with our providers, and arriving late affects you and other patients whose appointments are after yours.    Again, thank you for choosing [facility].  Our hope is that these requests will decrease the amount of time that you wait before being seen by our physicians.       _____________________________________________________________  Should you have questions after your visit to [facility], please contact our office at [phone] between the hours of 8:30 a.m. and 5:00 p.m.  Voicemails left after 4:30 p.m. will not be returned until the following business day.  For prescription refill requests, have your pharmacy contact our office with your prescription refill request.

## 2013-11-03 NOTE — Progress Notes (Signed)
[facility]  OFFICE PROGRESS NOTE  Cleda Mccreedy, MD [address] [place] [address]  DIAGNOSIS: Esophageal cancer - Plan: CBC with Differential, Comprehensive metabolic panel, Lactate dehydrogenase, Ferritin, CBC with Differential, Comprehensive metabolic panel, Lactate dehydrogenase, Ferritin, Consult to gastroenterology  Malignant neoplasm metastatic to both lungs  Liver metastases  Other emphysema  Chief Complaint  Patient presents with  . Squamous cell carcinoma of the esophagus    CURRENT THERAPY: Status post endoscopy and biopsy for esophageal mass performed on 10/27/2013.   INTERVAL HISTORY: Peter Neal 68 y.o. male returns for discussion of biopsy findings and recommendations regarding therapy for squamous cell carcinoma of the esophagus.  The patient lives alone and has abstained from alcohol for the last few weeks. He does smoke on a regular basis. He does regurgitate food and has difficult time with solids, avoiding them completely. He is able to and just to ensure slowly. He denies abdominal distention, fever, night sweats, lower extremity swelling or redness, diarrhea, constipation, and uses about 4 hydrocodone tablets per 24 hours to control symptoms of pain. He denies any worsening cough but does have chronic expectoration of clear material without hemoptysis, epistaxis, melena, or hematochezia. He also denies any urinary hesitancy or incontinence.  MEDICAL HISTORY: Past Medical History  Diagnosis Date  . Lower back pain   . Dysesthesia   . COPD (chronic obstructive pulmonary disease)   . Allergy   . Hypertension   . GERD (gastroesophageal reflux disease)   . Depression   . Anxiety   . Peripheral vascular disease   . DDD (degenerative disc disease), lumbar     Osteo arthritis  . Lumbar spondylosis   . Atrial fibrillation   . Diabetes mellitus without complication   . Cancer     INTERIM HISTORY: has  Peripheral vascular disease, unspecified; Dysphagia; Esophageal mass; Tobacco use disorder; and Alcohol abuse on his problem list.    ALLERGIES:  has No Known Allergies.  MEDICATIONS: has a current medication list which includes the following prescription(s): albuterol, alprazolam, cilostazol, hydrocodone-acetaminophen, montelukast, omeprazole, and tizanidine.  SURGICAL HISTORY:  Past Surgical History  Procedure Laterality Date  . Tonsillectomy    . Esophagogastroduodenoscopy N/A 10/27/2013    Procedure: ESOPHAGOGASTRODUODENOSCOPY (EGD);  Surgeon: Danie Binder, MD;  Location: [facility];  Service: Endoscopy;  Laterality: N/A;  WITH DILATION. GIVE PHENERGAN 12.5 MG IV ON CALL    FAMILY HISTORY: family history includes Cancer in his mother, sister, and sister; Diabetes in his mother, sister, and sister. There is no history of Colon cancer.  SOCIAL HISTORY:  reports that he has been smoking Cigarettes.  He has a 55 pack-year smoking history. He has never used smokeless tobacco. He reports that he drinks alcohol. He reports that he does not use illicit drugs.  REVIEW OF SYSTEMS:  Other than that discussed above is noncontributory.  PHYSICAL EXAMINATION: ECOG PERFORMANCE STATUS: 1 - Symptomatic but completely ambulatory  Blood pressure 141/94, pulse 103, temperature 97.9 F (36.6 C), temperature source Oral, resp. rate 18, weight 143 lb 14.4 oz (65.273 kg), SpO2 98.00%.  GENERAL:alert, no distress and comfortable. Sitting in a wheelchair. Cachectic. SKIN: skin color, texture, turgor are normal, no rashes or significant lesions EYES: PERLA; Conjunctiva are pink and non-injected, sclera clear SINUSES: No redness or tenderness over maxillary or ethmoid sinuses OROPHARYNX:no exudate, no erythema on lips, buccal mucosa, or tongue. Edentulous. NECK: supple, thyroid normal size, non-tender, without nodularity.  No masses CHEST: Increased AP diameter with no gynecomastia. LYMPH:  no palpable  lymphadenopathy in the cervical, axillary or inguinal LUNGS: clear to auscultation and percussion with normal breathing effort HEART: regular rate & rhythm and no murmurs. ABDOMEN:abdomen soft, non-tender and normal bowel sounds. Soft with no organomegaly, ascites, or CVA tenderness. No free fluid wave or shifting dullness. MUSCULOSKELETAL:no cyanosis of digits and no clubbing. Range of motion normal. Negative Homans sign. NEURO: alert & oriented x 3 with fluent speech, no focal motor/sensory deficits   LABORATORY DATA: Admission on 10/26/2013, Discharged on 10/27/2013  Component Date Value Ref Range Status  . WBC 10/26/2013 10.5  4.0 - 10.5 K/uL Final  . RBC 10/26/2013 4.83  4.22 - 5.81 MIL/uL Final  . Hemoglobin 10/26/2013 16.4  13.0 - 17.0 g/dL Final  . HCT 10/26/2013 47.2  39.0 - 52.0 % Final  . MCV 10/26/2013 97.7  78.0 - 100.0 fL Final  . MCH 10/26/2013 34.0  26.0 - 34.0 pg Final  . MCHC 10/26/2013 34.7  30.0 - 36.0 g/dL Final  . RDW 10/26/2013 13.5  11.5 - 15.5 % Final  . Platelets 10/26/2013 253  150 - 400 K/uL Final  . Neutrophils Relative % 10/26/2013 68  43 - 77 % Final  . Neutro Abs 10/26/2013 7.1  1.7 - 7.7 K/uL Final  . Lymphocytes Relative 10/26/2013 19  12 - 46 % Final  . Lymphs Abs 10/26/2013 2.0  0.7 - 4.0 K/uL Final  . Monocytes Relative 10/26/2013 12  3 - 12 % Final  . Monocytes Absolute 10/26/2013 1.3* 0.1 - 1.0 K/uL Final  . Eosinophils Relative 10/26/2013 1  0 - 5 % Final  . Eosinophils Absolute 10/26/2013 0.1  0.0 - 0.7 K/uL Final  . Basophils Relative 10/26/2013 0  0 - 1 % Final  . Basophils Absolute 10/26/2013 0.0  0.0 - 0.1 K/uL Final  . Sodium 10/26/2013 139  137 - 147 mEq/L Final  . Potassium 10/26/2013 3.5* 3.7 - 5.3 mEq/L Final  . Chloride 10/26/2013 97  96 - 112 mEq/L Final  . CO2 10/26/2013 26  19 - 32 mEq/L Final  . Glucose, Bld 10/26/2013 109* 70 - 99 mg/dL Final  . BUN 10/26/2013 13  6 - 23 mg/dL Final  . Creatinine, Ser 10/26/2013 0.86  0.50 -  1.35 mg/dL Final  . Calcium 10/26/2013 10.0  8.4 - 10.5 mg/dL Final  . Total Protein 10/26/2013 8.2  6.0 - 8.3 g/dL Final  . Albumin 10/26/2013 3.3* 3.5 - 5.2 g/dL Final  . AST 10/26/2013 17  0 - 37 U/L Final  . ALT 10/26/2013 11  0 - 53 U/L Final  . Alkaline Phosphatase 10/26/2013 125* 39 - 117 U/L Final  . Total Bilirubin 10/26/2013 0.5  0.3 - 1.2 mg/dL Final  . GFR calc non Af Amer 10/26/2013 86* >90 mL/min Final  . GFR calc Af Amer 10/26/2013 >90  >90 mL/min Final   Comment: (NOTE)                          The eGFR has been calculated using the CKD EPI equation.                          This calculation has not been validated in all clinical situations.  eGFR's persistently <90 mL/min signify possible Chronic Kidney                          Disease.  . Anion gap 10/26/2013 16* 5 - 15 Final  . Sodium 10/27/2013 141  137 - 147 mEq/L Final  . Potassium 10/27/2013 3.9  3.7 - 5.3 mEq/L Final  . Chloride 10/27/2013 101  96 - 112 mEq/L Final  . CO2 10/27/2013 29  19 - 32 mEq/L Final  . Glucose, Bld 10/27/2013 92  70 - 99 mg/dL Final  . BUN 10/27/2013 13  6 - 23 mg/dL Final  . Creatinine, Ser 10/27/2013 0.79  0.50 - 1.35 mg/dL Final  . Calcium 10/27/2013 9.2  8.4 - 10.5 mg/dL Final  . Total Protein 10/27/2013 7.0  6.0 - 8.3 g/dL Final  . Albumin 10/27/2013 2.8* 3.5 - 5.2 g/dL Final  . AST 10/27/2013 14  0 - 37 U/L Final  . ALT 10/27/2013 9  0 - 53 U/L Final  . Alkaline Phosphatase 10/27/2013 108  39 - 117 U/L Final  . Total Bilirubin 10/27/2013 0.5  0.3 - 1.2 mg/dL Final  . GFR calc non Af Amer 10/27/2013 89* >90 mL/min Final  . GFR calc Af Amer 10/27/2013 >90  >90 mL/min Final   Comment: (NOTE)                          The eGFR has been calculated using the CKD EPI equation.                          This calculation has not been validated in all clinical situations.                          eGFR's persistently <90 mL/min signify possible Chronic Kidney                           Disease.  . Anion gap 10/27/2013 11  5 - 15 Final  . WBC 10/27/2013 8.6  4.0 - 10.5 K/uL Final  . RBC 10/27/2013 4.37  4.22 - 5.81 MIL/uL Final  . Hemoglobin 10/27/2013 14.7  13.0 - 17.0 g/dL Final  . HCT 10/27/2013 43.1  39.0 - 52.0 % Final  . MCV 10/27/2013 98.6  78.0 - 100.0 fL Final  . MCH 10/27/2013 33.6  26.0 - 34.0 pg Final  . MCHC 10/27/2013 34.1  30.0 - 36.0 g/dL Final  . RDW 10/27/2013 13.6  11.5 - 15.5 % Final  . Platelets 10/27/2013 242  150 - 400 K/uL Final    PATHOLOGY:  for MARQUETTE, BLODGETT ([id number]) Patient: DEMERE, DOTZLER Collected: 10/27/2013 Client: [facility] Accession: [id number] Received: 10/27/2013 Barney Drain, MD DOB: 12-04-1945 Age: 21 Gender: M Reported: 10/28/2013 [address] Patient Ph: [phone] MRN #: [id number] Linna Hoff [place] Visit #: [id number] Chart #: Phone: Fax: CC: REPORT OF SURGICAL PATHOLOGY FINAL DIAGNOSIS Diagnosis 1. Esophagus, biopsy, mass - INVASIVE SQUAMOUS CELL CARCINOMA. 2. Stomach, biopsy - CHRONIC ATROPHIC GASTRITIS. - NO EVIDENCE OF HELICOBACTER PYLORI, INTESTINAL METAPLASIA, DYSPLASIA OR MALIGNANCY. Microscopic Comment 1. Dr. Oneida Alar was notified on 10-28-2013. 2. A Warthin-Starry stain is performed to determine the possibility of the presence of Helicobacter pylori. The Warthin-Starry stain is negative for organisms of Helicobacter pylori. Aldona Bar MD Pathologist, Electronic Signature (Case  signed 10/28/2013) Specimen Gross and Clinical Information Specimen(s) Obtained: 1. Esophagus, biopsy, mass 2. Stomach, biopsy Specimen Clinical Information 2. evaluate for H pylori; Pre-op: dysphagia, odynophagia; Post-op: near obstructing, distal esophageal mass, GE junction 42; lesion between 39-40 cm, mass at 29-35, gastritis, duodenal diverticulum, duodenitis Gross 1. Received in formalin are tan, soft tissue fragments that are submitted in toto. Number: 7, Size: 0.1 to 0.3 cm, 1 block. 2.  Received in formalin are tan, soft tissue fragments that are submitted in toto. Number: 5, Size: each 0.2 cm, 1 block. (SW:ds 10/27/13) 1 of 2 FINAL for Bacci, Reedy [phone]) Stain(s) used in Diagnosis: The following stain(s) were used in diagnosing the case: Warthin-Starry Stain. The control(s) stained appropriately. Report signed out from the following location(s) Technical Component and Interpretation performed at [facility], [place], [place] [address]. CLIA #: [id number],    Urinalysis No results found for this basename: colorurine,  appearanceur,  labspec,  phurine,  glucoseu,  hgbur,  bilirubinur,  ketonesur,  proteinur,  urobilinogen,  nitrite,  leukocytesur    RADIOGRAPHIC STUDIES: Dg Chest 2 View  10/26/2013   CLINICAL DATA:  Difficulty swallowing x1 week  EXAM: CHEST  2 VIEW  COMPARISON:  None.  FINDINGS: Chronic interstitial markings. No focal consolidation. No pleural effusion or pneumothorax.  The heart is normal in size.  Mild degenerative changes of the visualized thoracolumbar spine.  IMPRESSION: No evidence of acute cardiopulmonary disease.   Electronically Signed   By: Julian Hy M.D.   On: 10/26/2013 18:54   Ct Chest W Contrast  10/27/2013   CLINICAL DATA:  Esophageal mass  EXAM: CT CHEST, ABDOMEN, AND PELVIS WITH CONTRAST  TECHNIQUE: Multidetector CT imaging of the chest, abdomen and pelvis was performed following the standard protocol during bolus administration of intravenous contrast.  CONTRAST:  105m OMNIPAQUE IOHEXOL 300 MG/ML SOLN, 1054mOMNIPAQUE IOHEXOL 300 MG/ML SOLN  COMPARISON:  None  FINDINGS: CT CHEST FINDINGS  Mediastinum: The heart size is normal. There is no pericardial effusion. Calcified atherosclerotic disease involves the thoracic aorta as well as the RCA, LAD and left circumflex coronary arteries. The trachea appears patent and is midline.  Circumferential esophageal mass is identified. This is centered at the  level of the left atrium and measures roughly 3.6 x 1.8 x 10.1 cm.At the GE junction there are several small paraesophageal lymph nodes measuring up to 7 mm, image 53/series 6. Enhancing sub- carinal lymph node measures 7 mm, image 31/series 6. Small left paratracheal lymph node at the level of the thoracic inlet measures 7 mm, image 10/series 6. No right paratracheal, prevascular, AP window or hilar adenopathy. There is no axillary or supraclavicular adenopathy.  Lungs/Pleura: There is no pleural effusion. Dependent changes are noted in the lung bases posteriorly. Diffuse interstitial accentuation is identified. There is mild peripheral interstitial reticulation is identified. A fine nodular appearance is identified bilaterally. Several dominant nodules are noted. Index nodule in the right upper lobe measures 1.4 cm, image 30/ series 12. Index nodule in the left lower lobe measures 4 mm.  CT ABDOMEN AND PELVIS FINDINGS  Hepatobiliary: Multiple ill-defined low-attenuation lesions are identified in both lobes of liver. Index lesion within the left hepatic lobe measures 1.9 cm, image 13 of series 3. There is a lesion within the right hepatic lobe measuring 1 cm, image 15/series 3. Calcifications within the wall of gallbladder noted. There is no biliary dilatation.  Spleen: Appears normal.  Pancreas: Diffuse atrophy of the pancreas.  Stomach/Bowel: The stomach and the small bowel loops have a normal course and caliber. No obstruction. Normal appearance of the colon.  Adrenals/urinary tract: The adrenal glands are both normal. Normal appearance of both kidneys. The urinary bladder appears within normal limits.  Vascular/Lymphatic: Calcified atherosclerotic disease involves the abdominal aorta. Large gastrohepatic ligament lymph node measures 2.9 x 2.1 cm. No mesenteric adenopathy. There is no pelvic or inguinal adenopathy.  Reproductive: Prostate gland and seminal vesicles are unremarkable.  Musculoskeletal: Review of  the visualized bony structures shows advanced changes of a new vascular necrosis and secondary degenerative change of the right hip. The left hip appears normal. No aggressive lytic or sclerotic bone lesions noted.  Other: None  IMPRESSION: 1. 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 2. 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 3. Enlarged upper abdominal lymph node worrisome for metastatic adenopathy. 4. Small enhancing mediastinal lymph nodes may also represent sites of metastasis. 5. 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 6. Atherosclerotic disease including multi vessel coronary artery calcification.   Electronically Signed   By: Kerby Moors M.D.   On: 10/27/2013 19:20      ASSESSMENT:  #1. 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 #2. Chronic obstructive pulmonary disease. #3. Protein calorie malnutrition. #4. Gastroesophageal reflux disease. #5. Hypertension, on treatment. #6. Degenerative disc disease of the lumbar spine. #7. Chronic atrial fibrillation. #8. Diabetes mellitus, non-insulin requiring, controlled by diet.   PLAN:  #1. Discussion was held regarding chemotherapy treatment. In order to deliver treatment, the patient will need alternative means of nutritional support, specifically a PEG tube. #2. At present the patient wishes to be resuscitated should he have a catastrophe occur. #3. Without treatment he was quoted less than 6 months of islet with treatment perhaps only a year. You're require systemic chemotherapy requiring a infusion pump as well as a LifePort in order to deliver the drugs appropriately. #4. He was given MOST on to indicate his preferences regarding management should he not be able to make decisions for himself. #5. He was advised to call back within the next week so that arrangements can be made  through PEG tube insertion, life port insertion, and initiation of chemotherapy should he wish to be treated. #6. Alternative management would involve hospice care at home.    All questions were answered. The patient knows to call the clinic with any problems, questions or concerns. We can certainly see the patient much sooner if necessary.   I spent 40 minutes counseling the patient face to face. The total time spent in the appointment was 55 minutes.    Doroteo Bradford, MD 11/03/2013 3:16 PM  DISCLAIMER:  This note was dictated with voice recognition software.  Similar sounding words can inadvertently be transcribed inaccurately and may not be corrected upon review.

## 2013-11-03 NOTE — Telephone Encounter (Signed)
Pt is being referred back to [place] from the cancer center for PEG placement. They would like for him to have it before he starts chemo. Can we put him in ur urgent spot on 11/05/13 @ 1130. Please advise

## 2013-11-03 NOTE — Progress Notes (Signed)
Recent Results (from the past 2160 hour(s))  CBC WITH DIFFERENTIAL     Status: Abnormal   Collection Time    10/26/13  4:21 PM      Result Value Ref Range   WBC 10.5  4.0 - 10.5 K/uL   RBC 4.83  4.22 - 5.81 MIL/uL   Hemoglobin 16.4  13.0 - 17.0 g/dL   HCT 47.2  39.0 - 52.0 %   MCV 97.7  78.0 - 100.0 fL   MCH 34.0  26.0 - 34.0 pg   MCHC 34.7  30.0 - 36.0 g/dL   RDW 13.5  11.5 - 15.5 %   Platelets 253  150 - 400 K/uL   Neutrophils Relative % 68  43 - 77 %   Neutro Abs 7.1  1.7 - 7.7 K/uL   Lymphocytes Relative 19  12 - 46 %   Lymphs Abs 2.0  0.7 - 4.0 K/uL   Monocytes Relative 12  3 - 12 %   Monocytes Absolute 1.3 (*) 0.1 - 1.0 K/uL   Eosinophils Relative 1  0 - 5 %   Eosinophils Absolute 0.1  0.0 - 0.7 K/uL   Basophils Relative 0  0 - 1 %   Basophils Absolute 0.0  0.0 - 0.1 K/uL  COMPREHENSIVE METABOLIC PANEL     Status: Abnormal   Collection Time    10/26/13  4:21 PM      Result Value Ref Range   Sodium 139  137 - 147 mEq/L   Potassium 3.5 (*) 3.7 - 5.3 mEq/L   Chloride 97  96 - 112 mEq/L   CO2 26  19 - 32 mEq/L   Glucose, Bld 109 (*) 70 - 99 mg/dL   BUN 13  6 - 23 mg/dL   Creatinine, Ser 0.86  0.50 - 1.35 mg/dL   Calcium 10.0  8.4 - 10.5 mg/dL   Total Protein 8.2  6.0 - 8.3 g/dL   Albumin 3.3 (*) 3.5 - 5.2 g/dL   AST 17  0 - 37 U/L   ALT 11  0 - 53 U/L   Alkaline Phosphatase 125 (*) 39 - 117 U/L   Total Bilirubin 0.5  0.3 - 1.2 mg/dL   GFR calc non Af Amer 86 (*) >90 mL/min   GFR calc Af Amer >90  >90 mL/min   Comment: (NOTE)     The eGFR has been calculated using the CKD EPI equation.     This calculation has not been validated in all clinical situations.     eGFR's persistently <90 mL/min signify possible Chronic Kidney     Disease.   Anion gap 16 (*) 5 - 15  COMPREHENSIVE METABOLIC PANEL     Status: Abnormal   Collection Time    10/27/13  5:22 AM      Result Value Ref Range   Sodium 141  137 - 147 mEq/L   Potassium 3.9  3.7 - 5.3 mEq/L   Chloride 101  96 -  112 mEq/L   CO2 29  19 - 32 mEq/L   Glucose, Bld 92  70 - 99 mg/dL   BUN 13  6 - 23 mg/dL   Creatinine, Ser 0.79  0.50 - 1.35 mg/dL   Calcium 9.2  8.4 - 10.5 mg/dL   Total Protein 7.0  6.0 - 8.3 g/dL   Albumin 2.8 (*) 3.5 - 5.2 g/dL   AST 14  0 - 37 U/L   ALT 9  0 - 53  U/L   Alkaline Phosphatase 108  39 - 117 U/L   Total Bilirubin 0.5  0.3 - 1.2 mg/dL   GFR calc non Af Amer 89 (*) >90 mL/min   GFR calc Af Amer >90  >90 mL/min   Comment: (NOTE)     The eGFR has been calculated using the CKD EPI equation.     This calculation has not been validated in all clinical situations.     eGFR's persistently <90 mL/min signify possible Chronic Kidney     Disease.   Anion gap 11  5 - 15  CBC     Status: None   Collection Time    10/27/13  5:22 AM      Result Value Ref Range   WBC 8.6  4.0 - 10.5 K/uL   RBC 4.37  4.22 - 5.81 MIL/uL   Hemoglobin 14.7  13.0 - 17.0 g/dL   HCT 43.1  39.0 - 52.0 %   MCV 98.6  78.0 - 100.0 fL   MCH 33.6  26.0 - 34.0 pg   MCHC 34.1  30.0 - 36.0 g/dL   RDW 13.6  11.5 - 15.5 %   Platelets 242  150 - 400 K/uL   Blanche Selley's reason for visit today is for labs as scheduled per MD orders.  Venipuncture performed with a 23 gauge butterfly needle to left hand.  Rony Asare tolerated procedure well and without incident; questions were answered and patient was discharged.   Call Andreas Newport with information from peg tube appt [phone]

## 2013-11-04 ENCOUNTER — Other Ambulatory Visit: Payer: Self-pay

## 2013-11-04 DIAGNOSIS — C159 Malignant neoplasm of esophagus, unspecified: Secondary | ICD-10-CM

## 2013-11-04 LAB — FERRITIN: Ferritin: 553 ng/mL — ABNORMAL HIGH (ref 22–322)

## 2013-11-04 NOTE — Telephone Encounter (Signed)
REVIEWED. AGREE. 

## 2013-11-04 NOTE — Progress Notes (Signed)
[facility] called and will notify pt and Andreas Newport to the upcoming appt for peg tube on October 1st

## 2013-11-04 NOTE — Telephone Encounter (Addendum)
PER [facility] PUT PT ON SCHEDULE FOR PEG PLACEMENT ON 11/12/13 @ 9:00.

## 2013-11-04 NOTE — Telephone Encounter (Signed)
Pt's sister is aware of his appointment and time. Instruction are being mailed out today.

## 2013-11-05 ENCOUNTER — Encounter (HOSPITAL_COMMUNITY): Payer: Self-pay | Admitting: Pharmacy Technician

## 2013-11-06 ENCOUNTER — Telehealth (HOSPITAL_COMMUNITY): Payer: Self-pay | Admitting: *Deleted

## 2013-11-06 NOTE — Care Management Utilization Note (Signed)
UR completed 

## 2013-11-06 NOTE — Telephone Encounter (Signed)
Received a phone call from the patient's sister. He wants a second opinion and she is going to call back to let us know where he would like to go. He is not sure about having feeding tube placed. I advised her that it would be to his advantage if he wants to do treatment to do this sooner vs later. He has not heard from Dr. Oneida Alar office as far as a date and time for the procedure. He is also on blood thinner and questioned if he should stop this before procedure. Advised that the day surgery dept. Would call him and go over instructions for the procedure. Reassured her that we would be glad to refer him for second opinion.

## 2013-11-11 NOTE — OR Nursing (Signed)
Called patient to verify procedure and procedure time. Patient states, "I called and cancelled that 2 days ago until I get a second opinion." Patient stated that he wanted to postpone procedure until he had the second opiniion. Dr. Oneida Alar and Ginger at the office notified.

## 2013-11-12 ENCOUNTER — Ambulatory Visit (HOSPITAL_COMMUNITY): Admission: RE | Admit: 2013-11-12 | Payer: Medicare Other | Source: Ambulatory Visit | Admitting: Gastroenterology

## 2013-11-12 ENCOUNTER — Encounter (HOSPITAL_COMMUNITY): Admission: RE | Payer: Self-pay | Source: Ambulatory Visit

## 2013-11-12 SURGERY — EGD (ESOPHAGOGASTRODUODENOSCOPY)
Anesthesia: Moderate Sedation

## 2013-11-17 ENCOUNTER — Ambulatory Visit (HOSPITAL_COMMUNITY): Payer: Medicare Other

## 2013-11-24 ENCOUNTER — Ambulatory Visit (HOSPITAL_COMMUNITY): Payer: Medicare Other

## 2013-11-26 ENCOUNTER — Ambulatory Visit (HOSPITAL_COMMUNITY): Payer: Medicare Other

## 2013-11-27 ENCOUNTER — Encounter (HOSPITAL_BASED_OUTPATIENT_CLINIC_OR_DEPARTMENT_OTHER): Payer: Medicare Other

## 2013-11-27 ENCOUNTER — Encounter (HOSPITAL_COMMUNITY): Payer: Self-pay

## 2013-11-27 ENCOUNTER — Encounter (HOSPITAL_COMMUNITY): Payer: Medicare Other | Attending: Hematology and Oncology

## 2013-11-27 VITALS — BP 95/65 | HR 85 | Temp 97.9°F | Resp 18 | Wt 137.3 lb

## 2013-11-27 DIAGNOSIS — F419 Anxiety disorder, unspecified: Secondary | ICD-10-CM | POA: Diagnosis not present

## 2013-11-27 DIAGNOSIS — I739 Peripheral vascular disease, unspecified: Secondary | ICD-10-CM | POA: Diagnosis not present

## 2013-11-27 DIAGNOSIS — I482 Chronic atrial fibrillation: Secondary | ICD-10-CM | POA: Insufficient documentation

## 2013-11-27 DIAGNOSIS — R208 Other disturbances of skin sensation: Secondary | ICD-10-CM | POA: Insufficient documentation

## 2013-11-27 DIAGNOSIS — E119 Type 2 diabetes mellitus without complications: Secondary | ICD-10-CM | POA: Insufficient documentation

## 2013-11-27 DIAGNOSIS — C7801 Secondary malignant neoplasm of right lung: Secondary | ICD-10-CM

## 2013-11-27 DIAGNOSIS — J449 Chronic obstructive pulmonary disease, unspecified: Secondary | ICD-10-CM | POA: Insufficient documentation

## 2013-11-27 DIAGNOSIS — M5136 Other intervertebral disc degeneration, lumbar region: Secondary | ICD-10-CM | POA: Insufficient documentation

## 2013-11-27 DIAGNOSIS — C7802 Secondary malignant neoplasm of left lung: Secondary | ICD-10-CM | POA: Diagnosis not present

## 2013-11-27 DIAGNOSIS — E46 Unspecified protein-calorie malnutrition: Secondary | ICD-10-CM | POA: Insufficient documentation

## 2013-11-27 DIAGNOSIS — C787 Secondary malignant neoplasm of liver and intrahepatic bile duct: Secondary | ICD-10-CM | POA: Insufficient documentation

## 2013-11-27 DIAGNOSIS — Z79899 Other long term (current) drug therapy: Secondary | ICD-10-CM | POA: Insufficient documentation

## 2013-11-27 DIAGNOSIS — I4891 Unspecified atrial fibrillation: Secondary | ICD-10-CM

## 2013-11-27 DIAGNOSIS — I10 Essential (primary) hypertension: Secondary | ICD-10-CM | POA: Insufficient documentation

## 2013-11-27 DIAGNOSIS — K219 Gastro-esophageal reflux disease without esophagitis: Secondary | ICD-10-CM | POA: Diagnosis not present

## 2013-11-27 DIAGNOSIS — C159 Malignant neoplasm of esophagus, unspecified: Secondary | ICD-10-CM | POA: Insufficient documentation

## 2013-11-27 DIAGNOSIS — F17210 Nicotine dependence, cigarettes, uncomplicated: Secondary | ICD-10-CM | POA: Diagnosis not present

## 2013-11-27 LAB — COMPREHENSIVE METABOLIC PANEL
ALT: 21 U/L (ref 0–53)
ANION GAP: 14 (ref 5–15)
AST: 24 U/L (ref 0–37)
Albumin: 2.9 g/dL — ABNORMAL LOW (ref 3.5–5.2)
Alkaline Phosphatase: 478 U/L — ABNORMAL HIGH (ref 39–117)
BUN: 12 mg/dL (ref 6–23)
CO2: 27 meq/L (ref 19–32)
Calcium: 10.8 mg/dL — ABNORMAL HIGH (ref 8.4–10.5)
Chloride: 97 mEq/L (ref 96–112)
Creatinine, Ser: 0.65 mg/dL (ref 0.50–1.35)
GFR calc Af Amer: >90 mL/min (ref >90)
GLUCOSE: 128 mg/dL — AB (ref 70–99)
POTASSIUM: 3.4 meq/L — AB (ref 3.7–5.3)
Sodium: 138 mEq/L (ref 137–147)
TOTAL PROTEIN: 8.4 g/dL — AB (ref 6.0–8.3)
Total Bilirubin: 0.5 mg/dL (ref 0.3–1.2)

## 2013-11-27 LAB — CBC WITH DIFFERENTIAL/PLATELET
Basophils Absolute: 0 10*3/uL (ref 0.0–0.1)
Basophils Relative: 0 % (ref 0–1)
Eosinophils Absolute: 0.2 10*3/uL (ref 0.0–0.7)
Eosinophils Relative: 2 % (ref 0–5)
HCT: 43.3 % (ref 39.0–52.0)
HEMOGLOBIN: 14.6 g/dL (ref 13.0–17.0)
LYMPHS ABS: 2 10*3/uL (ref 0.7–4.0)
Lymphocytes Relative: 20 % (ref 12–46)
MCH: 32.2 pg (ref 26.0–34.0)
MCHC: 33.7 g/dL (ref 30.0–36.0)
MCV: 95.4 fL (ref 78.0–100.0)
MONOS PCT: 9 % (ref 3–12)
Monocytes Absolute: 0.9 10*3/uL (ref 0.1–1.0)
NEUTROS PCT: 69 % (ref 43–77)
Neutro Abs: 7 10*3/uL (ref 1.7–7.7)
Platelets: 400 10*3/uL (ref 150–400)
RBC: 4.54 MIL/uL (ref 4.22–5.81)
RDW: 12.4 % (ref 11.5–15.5)
WBC: 10.1 10*3/uL (ref 4.0–10.5)

## 2013-11-27 LAB — LACTATE DEHYDROGENASE: LDH: 141 U/L (ref 94–250)

## 2013-11-27 NOTE — Patient Instructions (Signed)
[facility] Discharge Instructions  RECOMMENDATIONS MADE BY THE CONSULTANT AND ANY TEST RESULTS WILL BE SENT TO YOUR REFERRING PHYSICIAN.  EXAM FINDINGS BY THE PHYSICIAN TODAY AND SIGNS OR SYMPTOMS TO REPORT TO CLINIC OR PRIMARY PHYSICIAN: You saw Dr Barnet Glasgow today  MEDICATIONS PRESCRIBED:  No new prescriptions  INSTRUCTIONS GIVEN AND DISCUSSED: Referral is being sent to Dr fields to set up a peg tube placement  SPECIAL INSTRUCTIONS/FOLLOW-UP: Return visit in 4 weeks with lab work  Thank you for choosing [facility] to provide your oncology and hematology care.  To afford each patient quality time with our providers, please arrive at least 15 minutes before your scheduled appointment time.  With your help, our goal is to use those 15 minutes to complete the necessary work-up to ensure our physicians have the information they need to help with your evaluation and healthcare recommendations.    Effective January 1st, 2014, we ask that you re-schedule your appointment with our physicians should you arrive 10 or more minutes late for your appointment.  We strive to give you quality time with our providers, and arriving late affects you and other patients whose appointments are after yours.    Again, thank you for choosing [facility].  Our hope is that these requests will decrease the amount of time that you wait before being seen by our physicians.       _____________________________________________________________  Should you have questions after your visit to [facility], please contact our office at [phone] between the hours of 8:30 a.m. and 5:00 p.m.  Voicemails left after 4:30 p.m. will not be returned until the following business day.  For prescription refill requests, have your pharmacy contact our office with your prescription refill request.

## 2013-11-27 NOTE — Progress Notes (Signed)
Labs for ldh,cbcd,cmp

## 2013-11-27 NOTE — Progress Notes (Signed)
[place]  OFFICE PROGRESS NOTE  Cleda Mccreedy, MD [address] [place] [address]  DIAGNOSIS: Esophageal cancer - Plan: CBC with Differential, Comprehensive metabolic panel, Lactate dehydrogenase, CBC with Differential, Comprehensive metabolic panel, Lactate dehydrogenase  Liver metastases - Plan: CBC with Differential, Comprehensive metabolic panel, Lactate dehydrogenase, CBC with Differential, Comprehensive metabolic panel, Lactate dehydrogenase  Malignant neoplasm metastatic to both lungs - Plan: CBC with Differential, Comprehensive metabolic panel, Lactate dehydrogenase, CBC with Differential, Comprehensive metabolic panel, Lactate dehydrogenase  Chief Complaint  Patient presents with  . 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 Had been scheduled for a PEG tube insertion by Dr. Oneida Alar.  INTERVAL HISTORY: Peter Neal 68 y.o. 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 He was scheduled for a PEG tube insertion on 11/12/2013 and canceled the procedure. He said difficulty in swallowing with no nausea, vomiting, diarrhea, constipation. He is able to keep down liquids. He denies any fever, night sweats, worsening cough, headache, skin rash, or seizures.  MEDICAL HISTORY: Past Medical History  Diagnosis Date  . Lower back pain   . Dysesthesia   . COPD (chronic obstructive pulmonary disease)   . Allergy   . Hypertension   . GERD (gastroesophageal reflux disease)   . Depression   . Anxiety   . Peripheral vascular disease   . DDD (degenerative disc disease), lumbar     Osteo arthritis  . Lumbar spondylosis   . Atrial fibrillation   .  Diabetes mellitus without complication   . Cancer     INTERIM HISTORY: has Peripheral vascular disease, unspecified; Dysphagia; Esophageal mass; Tobacco use disorder; and Alcohol abuse on his problem list.    ALLERGIES:  has No Known Allergies.  MEDICATIONS: has a current medication list which includes the following prescription(s): albuterol, alprazolam, cilostazol, hydrocodone-acetaminophen, montelukast, omeprazole, and tizanidine.  SURGICAL HISTORY:  Past Surgical History  Procedure Laterality Date  . Tonsillectomy    . Esophagogastroduodenoscopy N/A 10/27/2013    Procedure: ESOPHAGOGASTRODUODENOSCOPY (EGD);  Surgeon: Danie Binder, MD;  Location: [facility];  Service: Endoscopy;  Laterality: N/A;  WITH DILATION. GIVE PHENERGAN 12.5 MG IV ON CALL    FAMILY HISTORY: family history includes Cancer in his mother, sister, and sister; Diabetes in his mother, sister, and sister. There is no history of Colon cancer.  SOCIAL HISTORY:  reports that he has been smoking Cigarettes.  He has a 55 pack-year smoking history. He has never used smokeless tobacco. He reports that he drinks alcohol. He reports that he does not use illicit drugs.  REVIEW OF SYSTEMS:  Other than that discussed above is noncontributory.  PHYSICAL EXAMINATION: ECOG PERFORMANCE STATUS: 1 - Symptomatic but completely ambulatory  Blood pressure 95/65, pulse 85, temperature 97.9 F (36.6 C), temperature source Axillary, resp. rate 18, weight 137 lb 4.8 oz (62.279 kg).  GENERAL:alert, no distress and comfortable. Cachectic. Unkempt. SKIN: skin color, texture, turgor are normal, no rashes or significant lesions EYES: PERLA; Conjunctiva are pink and non-injected, sclera clear SINUSES: No redness or tenderness over maxillary or ethmoid sinuses OROPHARYNX:no exudate, no erythema on lips, buccal mucosa, or tongue. Edentulous. NECK: supple, thyroid normal size, non-tender, without nodularity. No masses CHEST:  Increased AP  diameter with no gynecomastia. LYMPH:  no palpable lymphadenopathy in the cervical, axillary or inguinal LUNGS: clear to auscultation and percussion with normal breathing effort HEART: Irregularly irregular with no S3 ABDOMEN:abdomen soft, non-tender and normal bowel sounds. Liver and spleen on March. No free fluid wave and shifting dullness. MUSCULOSKELETAL:no cyanosis of digits and no clubbing. Range of motion normal.  NEURO: alert & oriented x 3 with fluent speech, no focal motor/sensory deficits   LABORATORY DATA: Office Visit on 11/27/2013  Component Date Value Ref Range Status  . WBC 11/27/2013 10.1  4.0 - 10.5 K/uL Final  . RBC 11/27/2013 4.54  4.22 - 5.81 MIL/uL Final  . Hemoglobin 11/27/2013 14.6  13.0 - 17.0 g/dL Final  . HCT 11/27/2013 43.3  39.0 - 52.0 % Final  . MCV 11/27/2013 95.4  78.0 - 100.0 fL Final  . MCH 11/27/2013 32.2  26.0 - 34.0 pg Final  . MCHC 11/27/2013 33.7  30.0 - 36.0 g/dL Final  . RDW 11/27/2013 12.4  11.5 - 15.5 % Final  . Platelets 11/27/2013 400  150 - 400 K/uL Final  . Neutrophils Relative % 11/27/2013 69  43 - 77 % Final  . Neutro Abs 11/27/2013 7.0  1.7 - 7.7 K/uL Final  . Lymphocytes Relative 11/27/2013 20  12 - 46 % Final  . Lymphs Abs 11/27/2013 2.0  0.7 - 4.0 K/uL Final  . Monocytes Relative 11/27/2013 9  3 - 12 % Final  . Monocytes Absolute 11/27/2013 0.9  0.1 - 1.0 K/uL Final  . Eosinophils Relative 11/27/2013 2  0 - 5 % Final  . Eosinophils Absolute 11/27/2013 0.2  0.0 - 0.7 K/uL Final  . Basophils Relative 11/27/2013 0  0 - 1 % Final  . Basophils Absolute 11/27/2013 0.0  0.0 - 0.1 K/uL Final  Office Visit on 11/03/2013  Component Date Value Ref Range Status  . WBC 11/03/2013 8.9  4.0 - 10.5 K/uL Final  . RBC 11/03/2013 4.53  4.22 - 5.81 MIL/uL Final  . Hemoglobin 11/03/2013 15.4  13.0 - 17.0 g/dL Final  . HCT 11/03/2013 43.2  39.0 - 52.0 % Final  . MCV 11/03/2013 95.4  78.0 - 100.0 fL Final  . MCH 11/03/2013 34.0  26.0 - 34.0 pg Final   . MCHC 11/03/2013 35.6  30.0 - 36.0 g/dL Final  . RDW 11/03/2013 12.8  11.5 - 15.5 % Final  . Platelets 11/03/2013 328  150 - 400 K/uL Final  . Neutrophils Relative % 11/03/2013 68  43 - 77 % Final  . Neutro Abs 11/03/2013 6.1  1.7 - 7.7 K/uL Final  . Lymphocytes Relative 11/03/2013 23  12 - 46 % Final  . Lymphs Abs 11/03/2013 2.0  0.7 - 4.0 K/uL Final  . Monocytes Relative 11/03/2013 6  3 - 12 % Final  . Monocytes Absolute 11/03/2013 0.6  0.1 - 1.0 K/uL Final  . Eosinophils Relative 11/03/2013 2  0 - 5 % Final  . Eosinophils Absolute 11/03/2013 0.2  0.0 - 0.7 K/uL Final  . Basophils Relative 11/03/2013 1  0 - 1 % Final  . Basophils Absolute 11/03/2013 0.1  0.0 - 0.1 K/uL Final  . Sodium 11/03/2013 135* 137 - 147 mEq/L Final  . Potassium 11/03/2013 3.6* 3.7 - 5.3 mEq/L Final  . Chloride 11/03/2013 94* 96 - 112 mEq/L Final  . CO2 11/03/2013 27  19 - 32 mEq/L Final  . Glucose, Bld 11/03/2013 96  70 - 99 mg/dL Final  .  BUN 11/03/2013 11  6 - 23 mg/dL Final  . Creatinine, Ser 11/03/2013 0.78  0.50 - 1.35 mg/dL Final  . Calcium 11/03/2013 9.7  8.4 - 10.5 mg/dL Final  . Total Protein 11/03/2013 8.4* 6.0 - 8.3 g/dL Final  . Albumin 11/03/2013 3.1* 3.5 - 5.2 g/dL Final  . AST 11/03/2013 22  0 - 37 U/L Final  . ALT 11/03/2013 15  0 - 53 U/L Final  . Alkaline Phosphatase 11/03/2013 171* 39 - 117 U/L Final  . Total Bilirubin 11/03/2013 0.4  0.3 - 1.2 mg/dL Final  . GFR calc non Af Amer 11/03/2013 89* >90 mL/min Final  . GFR calc Af Amer 11/03/2013 >90  >90 mL/min Final   Comment: (NOTE)                          The eGFR has been calculated using the CKD EPI equation.                          This calculation has not been validated in all clinical situations.                          eGFR's persistently <90 mL/min signify possible Chronic Kidney                          Disease.  . Anion gap 11/03/2013 14  5 - 15 Final  . LDH 11/03/2013 172  94 - 250 U/L Final  . Ferritin 11/03/2013 553* 22  - 322 ng/mL Final   Performed at [facility]: Squamous cell carcinoma  Urinalysis No results found for this basename: colorurine,  appearanceur,  labspec,  phurine,  glucoseu,  hgbur,  bilirubinur,  ketonesur,  proteinur,  urobilinogen,  nitrite,  leukocytesur    RADIOGRAPHIC STUDIES: No results found.  ASSESSMENT:  #1. 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  #2. Chronic obstructive pulmonary disease.  #3. Protein calorie malnutrition.  #4. Gastroesophageal reflux disease.  #5. Hypertension, on treatment.  #6. Degenerative disc disease of the lumbar spine.  #7. Chronic atrial fibrillation.  #8. Diabetes mellitus, non-insulin requiring, controlled by diet.    PLAN:  #1. Reschedule PEG tube placement with Dr. Oneida Alar. #2. Not interested in chemotherapy but PEG tube was recommended for nutritional support. #3. Followup in 4 weeks with CBC, chem profile, LDH.   All questions were answered. The patient knows to call the clinic with any problems, questions or concerns. We can certainly see the patient much sooner if necessary.   I spent 25 minutes counseling the patient face to face. The total time spent in the appointment was 30 minutes.    Doroteo Bradford, MD 11/27/2013 9:35 AM  DISCLAIMER:  This note was dictated with voice recognition software.  Similar sounding words can inadvertently be transcribed inaccurately and may not be corrected upon review.

## 2013-11-30 ENCOUNTER — Telehealth: Payer: Self-pay | Admitting: Gastroenterology

## 2013-11-30 NOTE — Telephone Encounter (Signed)
[facility] PT FOR PEG ON OCT 20 OR OCT 23. NEEDS KEFLEX 1 GM IV AND PHENERGAN 12.5 MG IV IN PREOP.

## 2013-12-01 ENCOUNTER — Other Ambulatory Visit: Payer: Self-pay

## 2013-12-01 DIAGNOSIS — C159 Malignant neoplasm of esophagus, unspecified: Secondary | ICD-10-CM

## 2013-12-01 NOTE — Telephone Encounter (Signed)
Pt has been scheduled for the EGD/Peg insertion on Friday 12/04/2013 at 2:30 AM.  I called and informed his sister, Davy Pique and gave her the time and instructions. She was not at his house at the present to give me med list.

## 2013-12-01 NOTE — Telephone Encounter (Signed)
I called pt on his cell ( [phone]) and informed him of date, time and instructions. He could not get to his meds and I called Tammy at [facility] and she gave me the list of meds.

## 2013-12-01 NOTE — Telephone Encounter (Signed)
REVIEWED. EGD/PEG OCT 23 AR 230 PM.

## 2013-12-03 ENCOUNTER — Encounter (HOSPITAL_COMMUNITY): Payer: Self-pay | Admitting: Pharmacy Technician

## 2013-12-04 ENCOUNTER — Encounter (HOSPITAL_COMMUNITY): Payer: Self-pay | Admitting: *Deleted

## 2013-12-04 ENCOUNTER — Ambulatory Visit (HOSPITAL_COMMUNITY)
Admission: RE | Admit: 2013-12-04 | Discharge: 2013-12-04 | Disposition: A | Discharge status: Home / Self Care | Payer: Medicare Other | Source: Ambulatory Visit | Attending: Gastroenterology | Admitting: Gastroenterology

## 2013-12-04 ENCOUNTER — Encounter (HOSPITAL_COMMUNITY)
Admission: RE | Disposition: A | Discharge status: Home / Self Care | Payer: Self-pay | Source: Ambulatory Visit | Attending: Gastroenterology

## 2013-12-04 DIAGNOSIS — F418 Other specified anxiety disorders: Secondary | ICD-10-CM | POA: Diagnosis not present

## 2013-12-04 DIAGNOSIS — I10 Essential (primary) hypertension: Secondary | ICD-10-CM | POA: Insufficient documentation

## 2013-12-04 DIAGNOSIS — E119 Type 2 diabetes mellitus without complications: Secondary | ICD-10-CM | POA: Diagnosis not present

## 2013-12-04 DIAGNOSIS — K219 Gastro-esophageal reflux disease without esophagitis: Secondary | ICD-10-CM | POA: Diagnosis not present

## 2013-12-04 DIAGNOSIS — K229 Disease of esophagus, unspecified: Secondary | ICD-10-CM | POA: Diagnosis not present

## 2013-12-04 DIAGNOSIS — F17210 Nicotine dependence, cigarettes, uncomplicated: Secondary | ICD-10-CM | POA: Insufficient documentation

## 2013-12-04 DIAGNOSIS — C159 Malignant neoplasm of esophagus, unspecified: Secondary | ICD-10-CM

## 2013-12-04 DIAGNOSIS — R1310 Dysphagia, unspecified: Secondary | ICD-10-CM | POA: Insufficient documentation

## 2013-12-04 DIAGNOSIS — J449 Chronic obstructive pulmonary disease, unspecified: Secondary | ICD-10-CM | POA: Insufficient documentation

## 2013-12-04 DIAGNOSIS — Z79899 Other long term (current) drug therapy: Secondary | ICD-10-CM | POA: Insufficient documentation

## 2013-12-04 HISTORY — PX: ESOPHAGOGASTRODUODENOSCOPY: SHX5428

## 2013-12-04 HISTORY — PX: PEG PLACEMENT: SHX5437

## 2013-12-04 SURGERY — EGD (ESOPHAGOGASTRODUODENOSCOPY)
Anesthesia: Moderate Sedation

## 2013-12-04 MED ORDER — MEPERIDINE HCL 100 MG/ML IJ SOLN
INTRAMUSCULAR | Status: AC
  Filled 2013-12-04: qty 2

## 2013-12-04 MED ORDER — MEPERIDINE HCL 100 MG/ML IJ SOLN
INTRAMUSCULAR | Status: DC | PRN
Start: 2013-12-04 — End: 2013-12-04
  Administered 2013-12-04: 25 mg via INTRAVENOUS

## 2013-12-04 MED ORDER — CEFAZOLIN SODIUM-DEXTROSE 2-3 GM-% IV SOLR
INTRAVENOUS | Status: AC
  Filled 2013-12-04: qty 50

## 2013-12-04 MED ORDER — CEFAZOLIN SODIUM-DEXTROSE 2-3 GM-% IV SOLR
2.0000 g | Freq: Once | INTRAVENOUS | Status: AC
Start: 2013-12-04 — End: 2013-12-04
  Administered 2013-12-04: 2 g via INTRAVENOUS

## 2013-12-04 MED ORDER — BUTAMBEN-TETRACAINE-BENZOCAINE 2-2-14 % EX AERO
INHALATION_SPRAY | CUTANEOUS | Status: DC | PRN
  Administered 2013-12-04: 2 via TOPICAL

## 2013-12-04 MED ORDER — MIDAZOLAM HCL 5 MG/5ML IJ SOLN
INTRAMUSCULAR | Status: AC
  Filled 2013-12-04: qty 10

## 2013-12-04 MED ORDER — LIDOCAINE VISCOUS 2 % MT SOLN
OROMUCOSAL | Status: AC
  Filled 2013-12-04: qty 15

## 2013-12-04 MED ORDER — SODIUM CHLORIDE 0.9 % IJ SOLN
INTRAMUSCULAR | Status: AC
  Filled 2013-12-04: qty 10

## 2013-12-04 MED ORDER — STERILE WATER FOR IRRIGATION IR SOLN
Status: DC | PRN
  Administered 2013-12-04: 15:00:00

## 2013-12-04 MED ORDER — SODIUM CHLORIDE 0.9 % IV SOLN
INTRAVENOUS | Status: DC
  Administered 2013-12-04: 1000 mL via INTRAVENOUS

## 2013-12-04 MED ORDER — PROMETHAZINE HCL 25 MG/ML IJ SOLN
INTRAMUSCULAR | Status: AC
  Filled 2013-12-04: qty 1

## 2013-12-04 MED ORDER — MIDAZOLAM HCL 5 MG/5ML IJ SOLN
INTRAMUSCULAR | Status: DC | PRN
  Administered 2013-12-04: 2 mg via INTRAVENOUS
  Administered 2013-12-04 (×2): 1 mg via INTRAVENOUS

## 2013-12-04 MED ORDER — LIDOCAINE 1 % OPTIME INJ - NO CHARGE
INTRAMUSCULAR | Status: DC | PRN
  Administered 2013-12-04: 5 mL

## 2013-12-04 MED ORDER — PROMETHAZINE HCL 25 MG/ML IJ SOLN
12.5000 mg | Freq: Once | INTRAMUSCULAR | Status: AC
  Administered 2013-12-04: 12.5 mg via INTRAVENOUS

## 2013-12-04 NOTE — H&P (Signed)
Primary Care Physician:  Cleda Mccreedy, MD Primary Gastroenterologist:  Dr. Oneida Alar  Pre-Procedure History & Physical: HPI:  Peter Neal is a 68 y.o. male here for ESOPHAGEAL MASS/DYSPHAGIA.  Past Medical History  Diagnosis Date  . Lower back pain   . Dysesthesia   . COPD (chronic obstructive pulmonary disease)   . Allergy   . Hypertension   . GERD (gastroesophageal reflux disease)   . Depression   . Anxiety   . Peripheral vascular disease   . DDD (degenerative disc disease), lumbar     Osteo arthritis  . Lumbar spondylosis   . Atrial fibrillation   . Diabetes mellitus without complication   . Cancer    Past Surgical History  Procedure Laterality Date  . Tonsillectomy    . Esophagogastroduodenoscopy N/A 10/27/2013    Procedure: ESOPHAGOGASTRODUODENOSCOPY (EGD);  Surgeon: Danie Binder, MD;  Location: [facility];  Service: Endoscopy;  Laterality: N/A;  WITH DILATION. GIVE PHENERGAN 12.5 MG IV ON CALL   Prior to Admission medications   Medication Sig Start Date End Date Taking? Authorizing Provider  albuterol (PROAIR HFA) 108 (90 BASE) MCG/ACT inhaler Inhale 2 puffs into the lungs every 4 (four) hours as needed for wheezing or shortness of breath.    Yes Historical Provider, MD  ALPRAZolam Duanne Moron) 0.5 MG tablet Take 0.5 mg by mouth 4 (four) times daily.   Yes Historical Provider, MD  amLODipine (NORVASC) 5 MG tablet Take 5 mg by mouth daily.   Yes Historical Provider, MD  cilostazol (PLETAL) 100 MG tablet Take 100 mg by mouth 2 (two) times daily before a meal.   Yes Historical Provider, MD  HYDROcodone-acetaminophen (NORCO) 10-325 MG per tablet Take 1 tablet by mouth every 6 (six) hours as needed for moderate pain or severe pain.    Yes Historical Provider, MD  montelukast (SINGULAIR) 10 MG tablet Take 10 mg by mouth at bedtime.   Yes Historical Provider, MD  omeprazole (PRILOSEC) 20 MG capsule Take 20 mg by mouth 2 (two) times daily.   Yes Historical Provider, MD   tiZANidine (ZANAFLEX) 2 MG tablet Take 2 mg by mouth every 8 (eight) hours as needed for muscle spasms.   Yes Historical Provider, MD    Allergies as of 12/01/2013  . (No Known Allergies)    Family History  Problem Relation Age of Onset  . Cancer Mother   . Diabetes Mother   . Cancer Sister   . Diabetes Sister   . Cancer Sister   . Diabetes Sister   . Colon cancer Neg Hx     History   Social History  . Marital Status: Single    Spouse Name: N/A    Number of Children: N/A  . Years of Education: N/A   Occupational History  . Not on file.   Social History Main Topics  . Smoking status: Current Every Day Smoker -- 1.00 packs/day for 55 years    Types: Cigarettes  . Smokeless tobacco: Never Used  . Alcohol Use: Yes     Comment: ETOH abuse, 6-12 beers a day  . Drug Use: No  . Sexual Activity: Not on file   Other Topics Concern  . Not on file   Social History Narrative  . No narrative on file    Review of Systems: See HPI, otherwise negative ROS   Physical Exam: BP 92/72  Pulse 98  Temp(Src) 97.4 F (36.3 C) (Oral)  Resp 25  Ht 6\' 1"  (1.854 m)  Wt 137 lb (62.143 kg)  BMI 18.08 kg/m2  SpO2 96% General:   Alert,  pleasant and cooperative in NAD Head:  Normocephalic and atraumatic. Neck:  Supple; Lungs:  Clear throughout to auscultation.    Heart:  Regular rate and rhythm. Abdomen:  Soft, nontender and nondistended. Normal bowel sounds, without guarding, and without rebound.   Neurologic:  Alert and  oriented x4;  grossly normal neurologically.  Impression/Plan:    ESOPHAGEAL MASS/DYSPHAGIA  PLAN: 1. PEG FOR NUTRITION

## 2013-12-04 NOTE — Discharge Instructions (Signed)
YOU CAN USE THE TUBE FOR ENSURE OR BOOST 4 CANS A DAY STARTING OCT 24.    PLACE A DRY DRESSING OVER THE BUMPER ON THE PEG.  YOU CAN CLEAN HIS PEG SITE BY GENTLY PLACING A Q TIP WITH SMALL AMOUNT OF DIAL SOAP ON A Q TIP TO CLEAN SITE IF NEEDED.    CONTINUE Omeprazole.  CRUSH YOUR PILLS IF POSSIBLE.  FOLLOW A FULL LIQUID DIET. SEE INFO BELOW.     UPPER ENDOSCOPY AFTER CARE Read the instructions outlined below and refer to this sheet in the next week. These discharge instructions provide you with general information on caring for yourself after you leave the hospital. While your treatment has been planned according to the most current medical practices available, unavoidable complications occasionally occur. If you have any problems or questions after discharge, call DR. Zakyah Yanes, [phone].  ACTIVITY  You may resume your regular activity, but move at a slower pace for the next 24 hours.   Take frequent rest periods for the next 24 hours.   Walking will help get rid of the air and reduce the bloated feeling in your belly (abdomen).   No driving for 24 hours (because of the medicine (anesthesia) used during the test).   You may shower.   Do not sign any important legal documents or operate any machinery for 24 hours (because of the anesthesia used during the test).    NUTRITION  Drink plenty of fluids.   You may resume your normal diet as instructed by your doctor.   Begin with a light meal and progress to your normal diet. Heavy or fried foods are harder to digest and may make you feel sick to your stomach (nauseated).   Avoid alcoholic beverages for 24 hours or as instructed.    MEDICATIONS  You may resume your normal medications.   WHAT YOU CAN EXPECT TODAY  Some feelings of bloating in the abdomen.   Passage of more gas than usual.    IF YOU HAD A BIOPSY TAKEN DURING THE UPPER ENDOSCOPY:  Eat a soft diet IF YOU HAVE NAUSEA, BLOATING, ABDOMINAL PAIN, OR  VOMITING.    FINDING OUT THE RESULTS OF YOUR TEST Not all test results are available during your visit. DR. Oneida Alar WILL CALL YOU WITHIN 7 DAYS OF YOUR PROCEDUE WITH YOUR RESULTS. Do not assume everything is normal if you have not heard from DR. Lovelle Deitrick IN ONE WEEK, CALL HER OFFICE AT [phone].  SEEK IMMEDIATE MEDICAL ATTENTION AND CALL THE OFFICE: [phone] IF:  You have more than a spotting of blood in your stool.   Your belly is swollen (abdominal distention).   You are nauseated or vomiting.   You have a temperature over 101F.   You have abdominal pain or discomfort that is severe or gets worse throughout the day.    Full Liquid Diet A high-calorie, high-protein supplement should be used to meet your nutritional requirements when the full liquid diet is continued for more than 2 or 3 days. If this diet is to be used for an extended period of time (more than 7 days), a multivitamin should be considered.  Breads and Starches  Allowed: None are allowed except crackers WHOLE OR pureed (made into a thick, smooth soup) in soup.   Avoid: Any others.    Potatoes/Pasta/Rice  Allowed: ANY ITEM AS A SOUP OR SMALL PLATE OF MASHED POTATOES.       Vegetables  Allowed: Strained tomato or vegetable juice. Vegetables pureed  in soup.   Avoid: Any others.    Fruit  Allowed: Any strained fruit juices and fruit drinks. Include 1 serving of citrus or vitamin C-enriched fruit juice daily.   Avoid: Any others.  Meat and Meat Substitutes  Allowed: Egg  Avoid: Any meat, fish, or fowl. All cheese.  Milk  Allowed: Milk beverages, including milk shakes and instant breakfast mixes. Smooth yogurt.   Avoid: Any others. Avoid dairy products if not tolerated.    Soups and Combination Foods  Allowed: Broth, strained cream soups. Strained, broth-based soups.   Avoid: Any others.    Desserts and Sweets  Allowed: flavored gelatin,plain ice cream, sherbet, smooth pudding,  junket, fruit ices, frozen ice pops, pudding pops,, frozen fudge pops, chocolate syrup. Sugar, honey, jelly, syrup.   Avoid: Any others.  Fats and Oils  Allowed: Margarine, butter, cream, sour cream, oils.   Avoid: Any others.  Beverages  Allowed: All.   Avoid: None.  Condiments  Allowed: Iodized salt, pepper, spices, flavorings. Cocoa powder.   Avoid: Any others.    SAMPLE MEAL PLAN Breakfast   cup orange juice.   1 OR 2 EGGS   1 cup  milk.   1 cup beverage (coffee or tea).   Cream or sugar, if desired.    Midmorning Snack  2 SCRAMBLED OR HARD BOILED EGG   Lunch  1 cup cream soup.    cup fruit juice.   1 cup milk.    cup custard.   1 cup beverage (coffee or tea).   Cream or sugar, if desired.    Midafternoon Snack  1 cup milk shake.  Dinner  1 cup cream soup.    cup fruit juice.   1 cup milk.    cup pudding.   1 cup beverage (coffee or tea).   Cream or sugar, if desired.  Evening Snack  1 cup supplement.  To increase calories, add sugar, cream, butter, or margarine if possible. Nutritional supplements will also increase the total calories.

## 2013-12-06 NOTE — Op Note (Signed)
[facility] [address] [place], [address]   ENDOSCOPY PROCEDURE REPORT  PATIENT: Peter Neal, Peter Neal  MR#: [id number] BIRTHDATE: [birth year] , 63  yrs. old GENDER: male  ENDOSCOPIST: Barney Drain, MD REFERRED [id number] Barnet Glasgow, MD  PROCEDURE DATE: 12/04/2013 PROCEDURE:   EGD w/ percutaneous gastrostomy tube placement  INDICATIONS:dysphagia. MEDICATIONS: Promethazine (Phenergan) 12.5 mg IV, Demerol 25 mg IV, and Versed 4 mg IV TOPICAL ANESTHETIC:   Viscous Xylocaine ASA CLASS:  DESCRIPTION OF PROCEDURE:     Physical exam was performed.  Informed consent was obtained from the patient after explaining the benefits, risks, and alternatives to the procedure.  The patient was connected to the monitor and placed in the left lateral position.  Continuous oxygen was provided by nasal cannula and IV medicine administered through an indwelling cannula.  After administration of sedation, the patients esophagus was intubated and the EG-[id number] ([id number])  endoscope was advanced under direct visualization to the second portion of the duodenum.  The scope was removed slowly by carefully examining the color, texture, anatomy, and integrity of the mucosa on the way out.  The patient was recovered in endoscopy and discharged home in satisfactory condition.   ESOPHAGUS: A near circumferential mass was found in the mid esophagus.  unable to [place]. PEDIATRIC SCOPE PASSED WITH MILD RESISTANCE. STOMACH: The mucosa of the stomach appeared normal.   DUODENUM: The duodenal mucosa showed no abnormalities in the entire duodenum.  PEG PLACED IN MID TO DISTAL BODY OF STOMACH. TOP OF THE BUMPER AT3.5 CM. SKIN AT 2.5 CM. COMPLICATIONS: There were no immediate complications.  ENDOSCOPIC IMPRESSION: 1.   Near circumferential mass in the mid esophagus 2.   The mucosa of the stomach appeared normal 3.   The duodenal mucosa showed no abnormalities in the  entire duodenum  RECOMMENDATIONS: SUPPLEMENTS QID OMEPRAZOLE DAILY CRUSH PILLS FULL LIQUID DIET  REPEAT EXAM: _______________________________ eSignedBarney Drain, MD 2013-12-14 12:39 PM     CPT CODES: ICD CODES:  The ICD and CPT codes recommended by this software are interpretations from the data that the clinical staff has captured with the software.  The verification of the translation of this report to the ICD and CPT codes and modifiers is the sole responsibility of the health care institution and practicing physician where this report was generated.  [employer] will not be held responsible for the validity of the ICD and CPT codes included on this report.  AMA assumes no liability for data contained or not contained herein. CPT is a Designer, television/film set of the [employer].

## 2013-12-07 ENCOUNTER — Other Ambulatory Visit (HOSPITAL_COMMUNITY): Payer: Self-pay | Admitting: Hematology and Oncology

## 2013-12-07 ENCOUNTER — Other Ambulatory Visit (HOSPITAL_COMMUNITY): Payer: Self-pay | Admitting: *Deleted

## 2013-12-08 ENCOUNTER — Telehealth (HOSPITAL_COMMUNITY): Payer: Self-pay | Admitting: *Deleted

## 2013-12-08 ENCOUNTER — Encounter (HOSPITAL_COMMUNITY): Payer: Self-pay | Admitting: Gastroenterology

## 2013-12-08 NOTE — Telephone Encounter (Signed)
I called Mr. Tober sister, Arville Go to let them know that home health would be calling to instruct on PEG tube care/feedings. Patient's sister left me a message this am stating that they have had  hospice come out to see them and that they will be taking care of this. I spoke with Melissa at [facility] and she verified this info.

## 2013-12-13 DEATH — deceased

## 2013-12-25 ENCOUNTER — Ambulatory Visit (HOSPITAL_COMMUNITY): Payer: Medicare Other

## 2013-12-25 ENCOUNTER — Other Ambulatory Visit (HOSPITAL_COMMUNITY): Payer: Medicare Other

## 2014-05-03 ENCOUNTER — Ambulatory Visit: Payer: Medicare Other | Admitting: Surgery

## 2015-10-12 IMAGING — CT CT ABD-PELV W/ CM
2 of 5 series · 15 of 46 positions shown, 17 images · IV contrast (Omnipaque 300)
Comparison: None

CLINICAL DATA: Esophageal mass

EXAM:
CT CHEST, ABDOMEN, AND PELVIS WITH CONTRAST
TECHNIQUE: Multidetector CT imaging of the chest, abdomen and pelvis was
performed following the standard protocol during bolus
administration of intravenous contrast.
CONTRAST:  50mL OMNIPAQUE IOHEXOL 300 MG/ML SOLN, 100mL OMNIPAQUE
IOHEXOL 300 MG/ML SOLN

[Series 7: abdomen · axial · 0.82mm/px · z∈[-674,-249]mm · 12 of 96 slices shown, 14 images]
[im 6/96  soft-tissue]
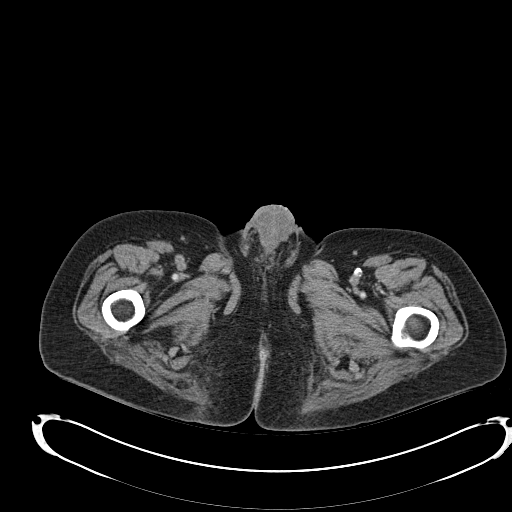
[im 6/96  bone]
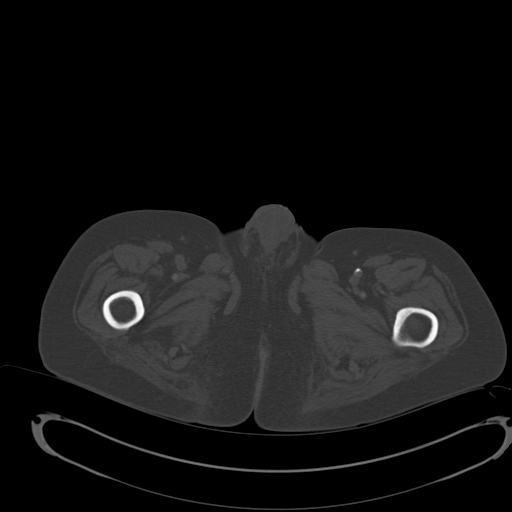
[im 16/96  soft-tissue]
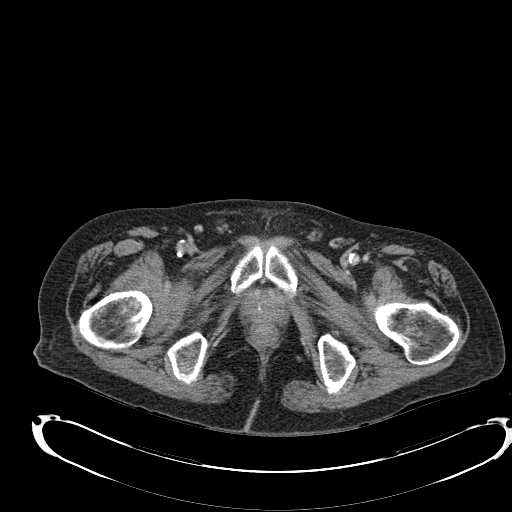
[im 21/96  soft-tissue]
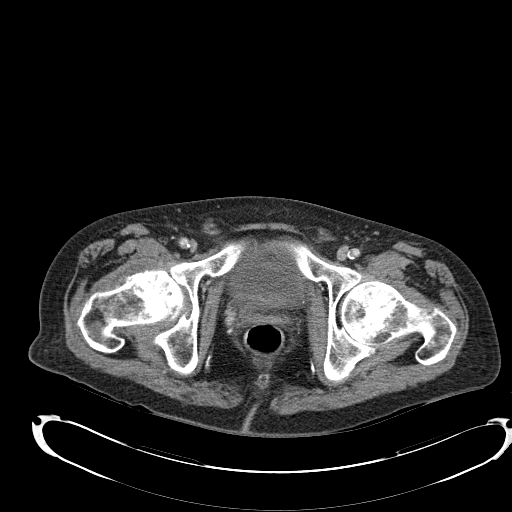
[im 31/96  soft-tissue]
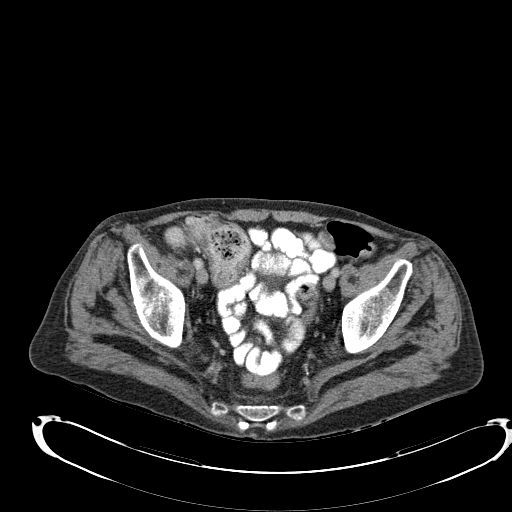
[im 36/96  soft-tissue]
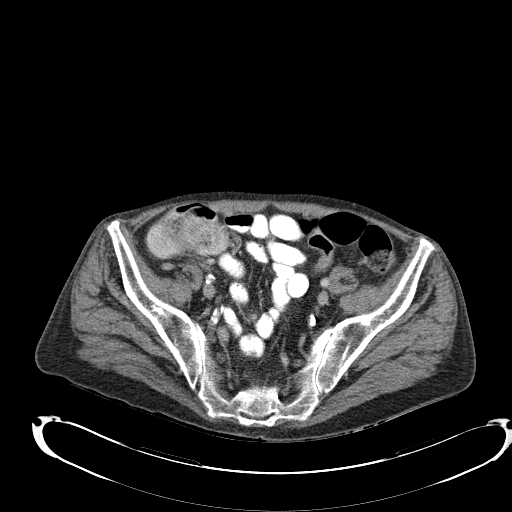
[im 46/96  soft-tissue]
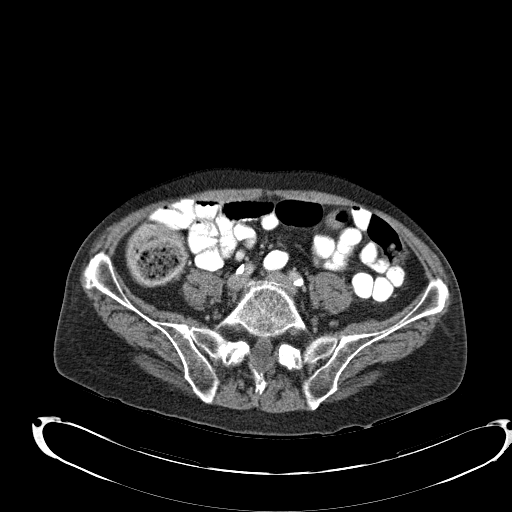
[im 51/96  soft-tissue]
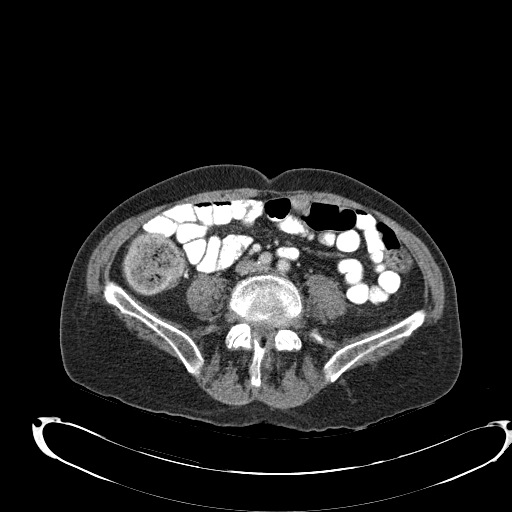
[im 61/96  soft-tissue]
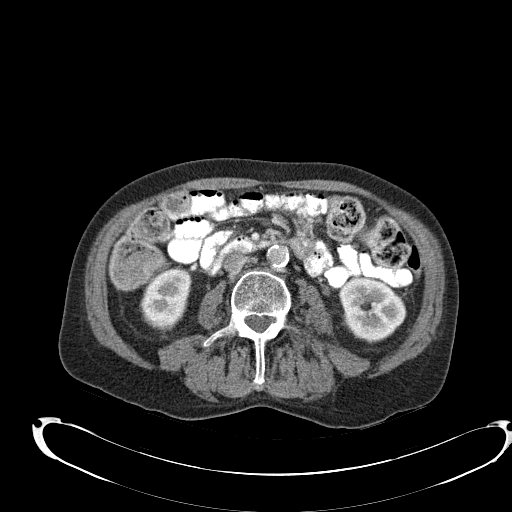
[im 66/96  soft-tissue]
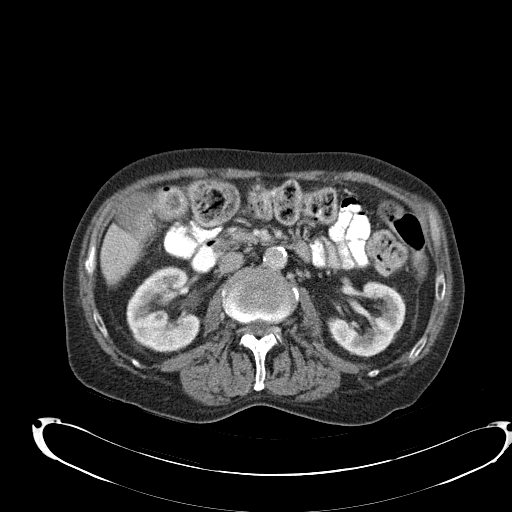
[im 66/96  bone]
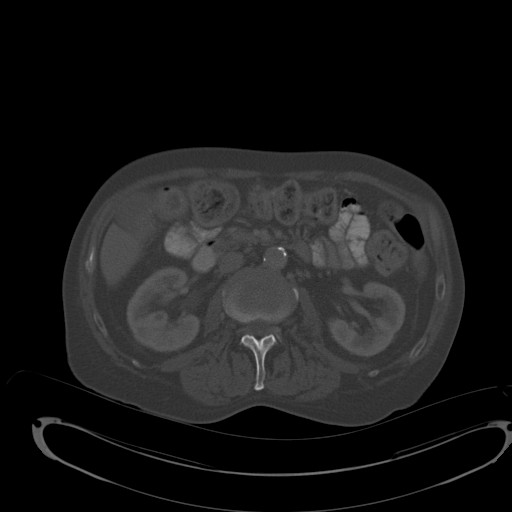
[im 76/96  soft-tissue]
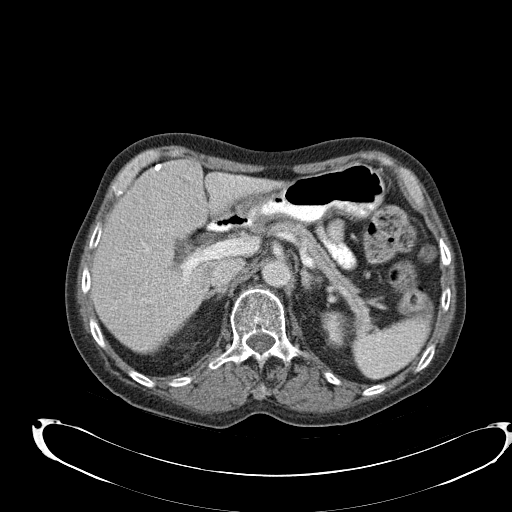
[im 81/96  soft-tissue]
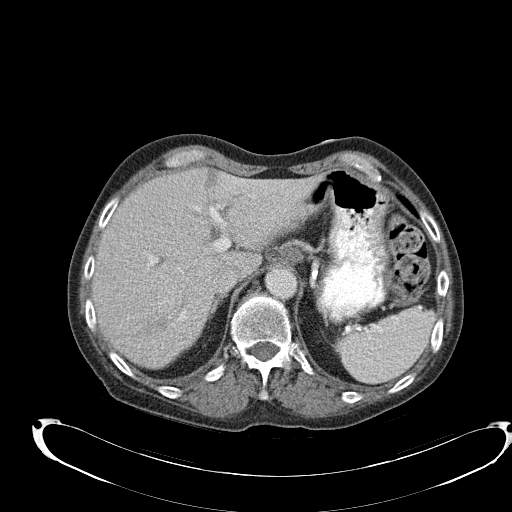
[im 91/96  soft-tissue]
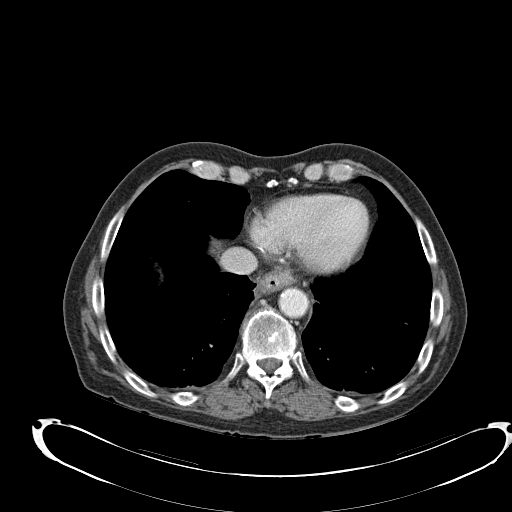

[Series 10: mpr cor post contrast (id) · coronal · 0.83mm/px · 3 of 79 slices shown]
[im 27/79  soft-tissue]
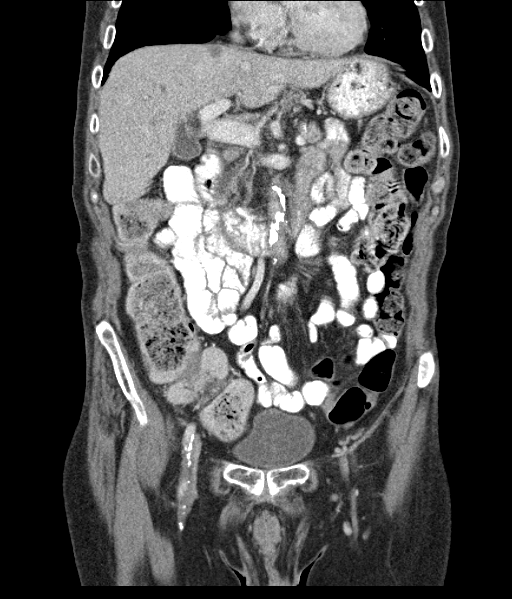
[im 35/79  soft-tissue]
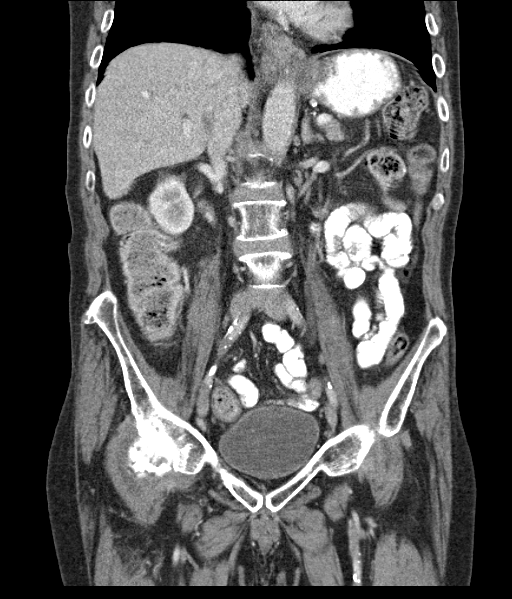
[im 44/79  soft-tissue]
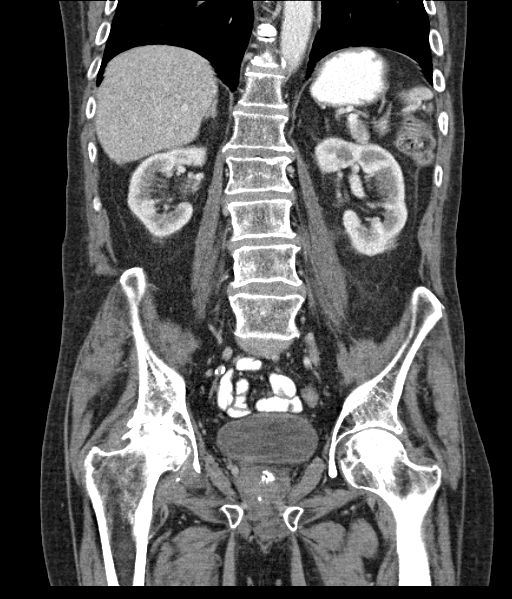

[15 of 46 positions shown; findings below may reference images not displayed]

FINDINGS: CT CHEST FINDINGS

Mediastinum: The heart size is normal. There is no pericardial
effusion. Calcified atherosclerotic disease involves the thoracic
aorta as well as the RCA, LAD and left circumflex coronary arteries.
The trachea appears patent and is midline.

Circumferential esophageal mass is identified. This is centered at
the level of the left atrium and measures roughly 3.6 x 1.8 x
cm.At the GE junction there are several small paraesophageal lymph
nodes measuring up to 7 mm, image 53/series 6. Enhancing sub-
carinal lymph node measures 7 mm, image 31/series 6. Small left
paratracheal lymph node at the level of the thoracic inlet measures
7 mm, image 10/series 6. No right paratracheal, prevascular, AP
window or hilar adenopathy. There is no axillary or supraclavicular
adenopathy.

Lungs/Pleura: There is no pleural effusion. Dependent changes are
noted in the lung bases posteriorly. Diffuse interstitial
accentuation is identified. There is mild peripheral interstitial
reticulation is identified. A fine nodular appearance is identified
bilaterally. Several dominant nodules are noted. Index nodule in the
right upper lobe measures 1.4 cm, image 30/ series 12. Index nodule
in the left lower lobe measures 4 mm[address]

CT ABDOMEN AND PELVIS FINDINGS

Hepatobiliary: Multiple ill-defined low-attenuation lesions are
identified in both lobes of liver. Index lesion within the left
hepatic lobe measures 1.9 cm, image 13 of series 3. There is a
lesion within the right hepatic lobe measuring 1 cm, image 15/series
3. Calcifications within the wall of gallbladder noted. There is no
biliary dilatation.

Spleen: Appears normal.

Pancreas: Diffuse atrophy of the pancreas.

Stomach/Bowel: The stomach and the small bowel loops have a normal
course and caliber. No obstruction. Normal appearance of the colon.

Adrenals/urinary tract: The adrenal glands are both normal. Normal
appearance of both kidneys. The urinary bladder appears within
normal limits.

Vascular/Lymphatic: Calcified atherosclerotic disease involves the
abdominal aorta. Large gastrohepatic ligament lymph node measures
2.9 x 2.1 cm. No mesenteric adenopathy. There is no pelvic or
inguinal adenopathy.

Reproductive: Prostate gland and seminal vesicles are unremarkable.

Musculoskeletal: Review of the visualized bony structures shows
advanced changes of a new vascular necrosis and secondary
degenerative change of the right hip. The left hip appears normal.
No aggressive lytic or sclerotic bone lesions noted.

Other: None
IMPRESSION: 1. Circumferential mass involving the thoracic esophagus is
identified compatible with primary esophageal neoplasm.
2. Evidence of multi focal liver and lung metastasis.
3. Enlarged upper abdominal lymph node worrisome for metastatic
adenopathy.
4. Small enhancing mediastinal lymph nodes may also represent sites
of metastasis.
5. Calcifications within the wall of gallbladder compatible with
porcelain gallbladder.
6. Atherosclerotic disease including multi vessel coronary artery
calcification.
